# Patient Record
Sex: Male | Born: 1950
Health system: Southern US, Community
[De-identification: ages and names within clinical notes are randomized; demographics above are authoritative.]

## PROBLEM LIST (undated history)

## (undated) ENCOUNTER — Emergency Department (HOSPITAL_COMMUNITY)

## (undated) DIAGNOSIS — I2510 Atherosclerotic heart disease of native coronary artery without angina pectoris: Secondary | ICD-10-CM

## (undated) DIAGNOSIS — H269 Unspecified cataract: Secondary | ICD-10-CM

## (undated) DIAGNOSIS — H8123 Vestibular neuronitis, bilateral: Secondary | ICD-10-CM

## (undated) DIAGNOSIS — E119 Type 2 diabetes mellitus without complications: Secondary | ICD-10-CM

## (undated) DIAGNOSIS — D649 Anemia, unspecified: Secondary | ICD-10-CM

## (undated) DIAGNOSIS — K219 Gastro-esophageal reflux disease without esophagitis: Secondary | ICD-10-CM

## (undated) DIAGNOSIS — I10 Essential (primary) hypertension: Secondary | ICD-10-CM

## (undated) DIAGNOSIS — T7840XA Allergy, unspecified, initial encounter: Secondary | ICD-10-CM

## (undated) HISTORY — DX: Allergy, unspecified, initial encounter: T78.40XA

## (undated) HISTORY — DX: Essential (primary) hypertension: I10

## (undated) HISTORY — DX: Type 2 diabetes mellitus without complications: E11.9

## (undated) HISTORY — DX: Unspecified cataract: H26.9

## (undated) HISTORY — DX: Gastro-esophageal reflux disease without esophagitis: K21.9

## (undated) HISTORY — DX: Atherosclerotic heart disease of native coronary artery without angina pectoris: I25.10

## (undated) HISTORY — PX: EYE SURGERY: SHX253

---

## 2005-07-09 DIAGNOSIS — K219 Gastro-esophageal reflux disease without esophagitis: Secondary | ICD-10-CM | POA: Insufficient documentation

## 2008-02-06 DIAGNOSIS — E782 Mixed hyperlipidemia: Secondary | ICD-10-CM

## 2008-02-06 DIAGNOSIS — E785 Hyperlipidemia, unspecified: Secondary | ICD-10-CM | POA: Insufficient documentation

## 2008-02-06 HISTORY — DX: Mixed hyperlipidemia: E78.2

## 2013-08-12 DIAGNOSIS — Z889 Allergy status to unspecified drugs, medicaments and biological substances status: Secondary | ICD-10-CM | POA: Insufficient documentation

## 2014-10-15 DIAGNOSIS — I10 Essential (primary) hypertension: Secondary | ICD-10-CM | POA: Insufficient documentation

## 2015-04-26 DIAGNOSIS — E349 Endocrine disorder, unspecified: Secondary | ICD-10-CM | POA: Insufficient documentation

## 2018-09-03 LAB — HM COLONOSCOPY

## 2019-07-23 DIAGNOSIS — Z0000 Encounter for general adult medical examination without abnormal findings: Secondary | ICD-10-CM | POA: Diagnosis not present

## 2019-08-03 DIAGNOSIS — J069 Acute upper respiratory infection, unspecified: Secondary | ICD-10-CM | POA: Diagnosis not present

## 2019-08-03 DIAGNOSIS — U071 COVID-19: Secondary | ICD-10-CM | POA: Diagnosis not present

## 2019-08-03 DIAGNOSIS — R42 Dizziness and giddiness: Secondary | ICD-10-CM | POA: Diagnosis not present

## 2019-08-06 DIAGNOSIS — E119 Type 2 diabetes mellitus without complications: Secondary | ICD-10-CM | POA: Diagnosis not present

## 2019-08-06 DIAGNOSIS — E213 Hyperparathyroidism, unspecified: Secondary | ICD-10-CM | POA: Diagnosis not present

## 2019-08-06 DIAGNOSIS — Q8789 Other specified congenital malformation syndromes, not elsewhere classified: Secondary | ICD-10-CM | POA: Diagnosis not present

## 2019-08-06 DIAGNOSIS — Q892 Congenital malformations of other endocrine glands: Secondary | ICD-10-CM | POA: Diagnosis not present

## 2019-08-06 DIAGNOSIS — I10 Essential (primary) hypertension: Secondary | ICD-10-CM | POA: Diagnosis not present

## 2019-08-10 DIAGNOSIS — U071 COVID-19: Secondary | ICD-10-CM | POA: Diagnosis not present

## 2019-08-10 DIAGNOSIS — E782 Mixed hyperlipidemia: Secondary | ICD-10-CM | POA: Diagnosis not present

## 2019-08-10 DIAGNOSIS — K219 Gastro-esophageal reflux disease without esophagitis: Secondary | ICD-10-CM | POA: Diagnosis not present

## 2019-08-10 DIAGNOSIS — E119 Type 2 diabetes mellitus without complications: Secondary | ICD-10-CM | POA: Diagnosis not present

## 2019-08-10 DIAGNOSIS — N529 Male erectile dysfunction, unspecified: Secondary | ICD-10-CM | POA: Diagnosis not present

## 2019-08-10 DIAGNOSIS — Z0001 Encounter for general adult medical examination with abnormal findings: Secondary | ICD-10-CM | POA: Diagnosis not present

## 2019-08-10 DIAGNOSIS — I10 Essential (primary) hypertension: Secondary | ICD-10-CM | POA: Diagnosis not present

## 2019-08-10 DIAGNOSIS — E213 Hyperparathyroidism, unspecified: Secondary | ICD-10-CM | POA: Diagnosis not present

## 2019-11-03 DIAGNOSIS — R5383 Other fatigue: Secondary | ICD-10-CM | POA: Diagnosis not present

## 2019-11-03 DIAGNOSIS — I10 Essential (primary) hypertension: Secondary | ICD-10-CM | POA: Diagnosis not present

## 2019-11-03 DIAGNOSIS — R079 Chest pain, unspecified: Secondary | ICD-10-CM | POA: Diagnosis not present

## 2019-11-03 DIAGNOSIS — E119 Type 2 diabetes mellitus without complications: Secondary | ICD-10-CM | POA: Diagnosis not present

## 2019-11-03 DIAGNOSIS — I2510 Atherosclerotic heart disease of native coronary artery without angina pectoris: Secondary | ICD-10-CM | POA: Diagnosis not present

## 2019-11-03 DIAGNOSIS — Z7982 Long term (current) use of aspirin: Secondary | ICD-10-CM | POA: Diagnosis not present

## 2019-11-03 DIAGNOSIS — R0789 Other chest pain: Secondary | ICD-10-CM | POA: Diagnosis not present

## 2019-11-03 DIAGNOSIS — I219 Acute myocardial infarction, unspecified: Secondary | ICD-10-CM | POA: Insufficient documentation

## 2019-11-03 DIAGNOSIS — E782 Mixed hyperlipidemia: Secondary | ICD-10-CM | POA: Diagnosis not present

## 2019-11-03 DIAGNOSIS — I249 Acute ischemic heart disease, unspecified: Secondary | ICD-10-CM | POA: Diagnosis not present

## 2019-11-03 DIAGNOSIS — I214 Non-ST elevation (NSTEMI) myocardial infarction: Secondary | ICD-10-CM | POA: Diagnosis not present

## 2019-11-03 DIAGNOSIS — R778 Other specified abnormalities of plasma proteins: Secondary | ICD-10-CM | POA: Diagnosis not present

## 2019-11-03 DIAGNOSIS — Z6826 Body mass index (BMI) 26.0-26.9, adult: Secondary | ICD-10-CM | POA: Diagnosis not present

## 2019-11-03 DIAGNOSIS — Z8249 Family history of ischemic heart disease and other diseases of the circulatory system: Secondary | ICD-10-CM | POA: Diagnosis not present

## 2019-11-03 DIAGNOSIS — R739 Hyperglycemia, unspecified: Secondary | ICD-10-CM | POA: Diagnosis not present

## 2019-11-03 DIAGNOSIS — I4510 Unspecified right bundle-branch block: Secondary | ICD-10-CM | POA: Diagnosis not present

## 2019-11-03 DIAGNOSIS — I25110 Atherosclerotic heart disease of native coronary artery with unstable angina pectoris: Secondary | ICD-10-CM | POA: Diagnosis not present

## 2019-11-03 DIAGNOSIS — I160 Hypertensive urgency: Secondary | ICD-10-CM | POA: Diagnosis not present

## 2019-11-03 DIAGNOSIS — K219 Gastro-esophageal reflux disease without esophagitis: Secondary | ICD-10-CM | POA: Diagnosis not present

## 2019-11-03 DIAGNOSIS — E1165 Type 2 diabetes mellitus with hyperglycemia: Secondary | ICD-10-CM | POA: Diagnosis not present

## 2019-11-03 DIAGNOSIS — E785 Hyperlipidemia, unspecified: Secondary | ICD-10-CM | POA: Diagnosis not present

## 2019-11-03 DIAGNOSIS — R9431 Abnormal electrocardiogram [ECG] [EKG]: Secondary | ICD-10-CM | POA: Diagnosis not present

## 2019-11-03 HISTORY — DX: Acute myocardial infarction, unspecified: I21.9

## 2019-11-04 HISTORY — PX: CARDIAC CATHETERIZATION: SHX172

## 2019-11-05 DIAGNOSIS — E785 Hyperlipidemia, unspecified: Secondary | ICD-10-CM | POA: Diagnosis not present

## 2019-11-05 DIAGNOSIS — I2510 Atherosclerotic heart disease of native coronary artery without angina pectoris: Secondary | ICD-10-CM | POA: Diagnosis not present

## 2019-11-05 DIAGNOSIS — I25110 Atherosclerotic heart disease of native coronary artery with unstable angina pectoris: Secondary | ICD-10-CM | POA: Diagnosis not present

## 2019-11-05 DIAGNOSIS — I10 Essential (primary) hypertension: Secondary | ICD-10-CM | POA: Diagnosis not present

## 2019-11-05 DIAGNOSIS — I4510 Unspecified right bundle-branch block: Secondary | ICD-10-CM | POA: Diagnosis not present

## 2019-11-05 DIAGNOSIS — R9431 Abnormal electrocardiogram [ECG] [EKG]: Secondary | ICD-10-CM | POA: Diagnosis not present

## 2019-11-05 DIAGNOSIS — E119 Type 2 diabetes mellitus without complications: Secondary | ICD-10-CM | POA: Diagnosis not present

## 2019-11-05 DIAGNOSIS — I214 Non-ST elevation (NSTEMI) myocardial infarction: Secondary | ICD-10-CM | POA: Diagnosis not present

## 2019-11-06 DIAGNOSIS — I2510 Atherosclerotic heart disease of native coronary artery without angina pectoris: Secondary | ICD-10-CM | POA: Insufficient documentation

## 2019-11-11 DIAGNOSIS — I219 Acute myocardial infarction, unspecified: Secondary | ICD-10-CM | POA: Diagnosis not present

## 2019-11-11 DIAGNOSIS — E7800 Pure hypercholesterolemia, unspecified: Secondary | ICD-10-CM | POA: Diagnosis not present

## 2019-11-11 DIAGNOSIS — I2510 Atherosclerotic heart disease of native coronary artery without angina pectoris: Secondary | ICD-10-CM | POA: Diagnosis not present

## 2019-11-11 DIAGNOSIS — E119 Type 2 diabetes mellitus without complications: Secondary | ICD-10-CM | POA: Diagnosis not present

## 2019-11-19 DIAGNOSIS — I214 Non-ST elevation (NSTEMI) myocardial infarction: Secondary | ICD-10-CM | POA: Diagnosis not present

## 2019-11-19 DIAGNOSIS — Z955 Presence of coronary angioplasty implant and graft: Secondary | ICD-10-CM | POA: Diagnosis not present

## 2019-11-20 DIAGNOSIS — E782 Mixed hyperlipidemia: Secondary | ICD-10-CM | POA: Diagnosis not present

## 2019-11-20 DIAGNOSIS — I10 Essential (primary) hypertension: Secondary | ICD-10-CM | POA: Diagnosis not present

## 2019-11-20 DIAGNOSIS — I214 Non-ST elevation (NSTEMI) myocardial infarction: Secondary | ICD-10-CM | POA: Diagnosis not present

## 2019-11-20 DIAGNOSIS — I25110 Atherosclerotic heart disease of native coronary artery with unstable angina pectoris: Secondary | ICD-10-CM | POA: Diagnosis not present

## 2019-11-20 DIAGNOSIS — E119 Type 2 diabetes mellitus without complications: Secondary | ICD-10-CM | POA: Diagnosis not present

## 2019-11-24 DIAGNOSIS — Z955 Presence of coronary angioplasty implant and graft: Secondary | ICD-10-CM | POA: Diagnosis not present

## 2019-11-24 DIAGNOSIS — I214 Non-ST elevation (NSTEMI) myocardial infarction: Secondary | ICD-10-CM | POA: Diagnosis not present

## 2019-11-26 DIAGNOSIS — I214 Non-ST elevation (NSTEMI) myocardial infarction: Secondary | ICD-10-CM | POA: Diagnosis not present

## 2019-11-26 DIAGNOSIS — Z955 Presence of coronary angioplasty implant and graft: Secondary | ICD-10-CM | POA: Diagnosis not present

## 2019-12-01 DIAGNOSIS — Z955 Presence of coronary angioplasty implant and graft: Secondary | ICD-10-CM | POA: Diagnosis not present

## 2019-12-01 DIAGNOSIS — I214 Non-ST elevation (NSTEMI) myocardial infarction: Secondary | ICD-10-CM | POA: Diagnosis not present

## 2019-12-03 DIAGNOSIS — Z955 Presence of coronary angioplasty implant and graft: Secondary | ICD-10-CM | POA: Diagnosis not present

## 2019-12-03 DIAGNOSIS — I214 Non-ST elevation (NSTEMI) myocardial infarction: Secondary | ICD-10-CM | POA: Diagnosis not present

## 2019-12-08 DIAGNOSIS — I214 Non-ST elevation (NSTEMI) myocardial infarction: Secondary | ICD-10-CM | POA: Diagnosis not present

## 2019-12-08 DIAGNOSIS — Z955 Presence of coronary angioplasty implant and graft: Secondary | ICD-10-CM | POA: Diagnosis not present

## 2019-12-10 DIAGNOSIS — I214 Non-ST elevation (NSTEMI) myocardial infarction: Secondary | ICD-10-CM | POA: Diagnosis not present

## 2019-12-10 DIAGNOSIS — Z955 Presence of coronary angioplasty implant and graft: Secondary | ICD-10-CM | POA: Diagnosis not present

## 2019-12-15 DIAGNOSIS — I214 Non-ST elevation (NSTEMI) myocardial infarction: Secondary | ICD-10-CM | POA: Diagnosis not present

## 2019-12-15 DIAGNOSIS — Z955 Presence of coronary angioplasty implant and graft: Secondary | ICD-10-CM | POA: Diagnosis not present

## 2019-12-17 DIAGNOSIS — Z955 Presence of coronary angioplasty implant and graft: Secondary | ICD-10-CM | POA: Diagnosis not present

## 2019-12-17 DIAGNOSIS — I214 Non-ST elevation (NSTEMI) myocardial infarction: Secondary | ICD-10-CM | POA: Diagnosis not present

## 2019-12-22 DIAGNOSIS — I214 Non-ST elevation (NSTEMI) myocardial infarction: Secondary | ICD-10-CM | POA: Diagnosis not present

## 2019-12-22 DIAGNOSIS — Z955 Presence of coronary angioplasty implant and graft: Secondary | ICD-10-CM | POA: Diagnosis not present

## 2019-12-24 DIAGNOSIS — I214 Non-ST elevation (NSTEMI) myocardial infarction: Secondary | ICD-10-CM | POA: Diagnosis not present

## 2019-12-24 DIAGNOSIS — Z955 Presence of coronary angioplasty implant and graft: Secondary | ICD-10-CM | POA: Diagnosis not present

## 2019-12-28 DIAGNOSIS — Z0000 Encounter for general adult medical examination without abnormal findings: Secondary | ICD-10-CM | POA: Diagnosis not present

## 2019-12-31 DIAGNOSIS — I214 Non-ST elevation (NSTEMI) myocardial infarction: Secondary | ICD-10-CM | POA: Diagnosis not present

## 2019-12-31 DIAGNOSIS — Z955 Presence of coronary angioplasty implant and graft: Secondary | ICD-10-CM | POA: Diagnosis not present

## 2020-01-05 DIAGNOSIS — I214 Non-ST elevation (NSTEMI) myocardial infarction: Secondary | ICD-10-CM | POA: Diagnosis not present

## 2020-01-05 DIAGNOSIS — Z955 Presence of coronary angioplasty implant and graft: Secondary | ICD-10-CM | POA: Diagnosis not present

## 2020-01-07 DIAGNOSIS — Z955 Presence of coronary angioplasty implant and graft: Secondary | ICD-10-CM | POA: Diagnosis not present

## 2020-01-07 DIAGNOSIS — I214 Non-ST elevation (NSTEMI) myocardial infarction: Secondary | ICD-10-CM | POA: Diagnosis not present

## 2020-01-12 DIAGNOSIS — I214 Non-ST elevation (NSTEMI) myocardial infarction: Secondary | ICD-10-CM | POA: Diagnosis not present

## 2020-01-12 DIAGNOSIS — Z955 Presence of coronary angioplasty implant and graft: Secondary | ICD-10-CM | POA: Diagnosis not present

## 2020-01-14 DIAGNOSIS — I214 Non-ST elevation (NSTEMI) myocardial infarction: Secondary | ICD-10-CM | POA: Diagnosis not present

## 2020-01-14 DIAGNOSIS — Z955 Presence of coronary angioplasty implant and graft: Secondary | ICD-10-CM | POA: Diagnosis not present

## 2020-01-19 DIAGNOSIS — Z955 Presence of coronary angioplasty implant and graft: Secondary | ICD-10-CM | POA: Diagnosis not present

## 2020-01-19 DIAGNOSIS — I214 Non-ST elevation (NSTEMI) myocardial infarction: Secondary | ICD-10-CM | POA: Diagnosis not present

## 2020-01-21 DIAGNOSIS — I214 Non-ST elevation (NSTEMI) myocardial infarction: Secondary | ICD-10-CM | POA: Diagnosis not present

## 2020-01-21 DIAGNOSIS — Z955 Presence of coronary angioplasty implant and graft: Secondary | ICD-10-CM | POA: Diagnosis not present

## 2020-01-26 DIAGNOSIS — I214 Non-ST elevation (NSTEMI) myocardial infarction: Secondary | ICD-10-CM | POA: Diagnosis not present

## 2020-01-26 DIAGNOSIS — Z955 Presence of coronary angioplasty implant and graft: Secondary | ICD-10-CM | POA: Diagnosis not present

## 2020-01-28 DIAGNOSIS — Z955 Presence of coronary angioplasty implant and graft: Secondary | ICD-10-CM | POA: Diagnosis not present

## 2020-01-28 DIAGNOSIS — I214 Non-ST elevation (NSTEMI) myocardial infarction: Secondary | ICD-10-CM | POA: Diagnosis not present

## 2020-02-02 DIAGNOSIS — Z955 Presence of coronary angioplasty implant and graft: Secondary | ICD-10-CM | POA: Diagnosis not present

## 2020-02-02 DIAGNOSIS — I214 Non-ST elevation (NSTEMI) myocardial infarction: Secondary | ICD-10-CM | POA: Diagnosis not present

## 2020-02-04 DIAGNOSIS — Z955 Presence of coronary angioplasty implant and graft: Secondary | ICD-10-CM | POA: Diagnosis not present

## 2020-02-04 DIAGNOSIS — I214 Non-ST elevation (NSTEMI) myocardial infarction: Secondary | ICD-10-CM | POA: Diagnosis not present

## 2020-03-01 DIAGNOSIS — I25110 Atherosclerotic heart disease of native coronary artery with unstable angina pectoris: Secondary | ICD-10-CM | POA: Diagnosis not present

## 2020-03-11 DIAGNOSIS — E119 Type 2 diabetes mellitus without complications: Secondary | ICD-10-CM | POA: Diagnosis not present

## 2020-03-11 DIAGNOSIS — E213 Hyperparathyroidism, unspecified: Secondary | ICD-10-CM | POA: Diagnosis not present

## 2020-03-17 DIAGNOSIS — E782 Mixed hyperlipidemia: Secondary | ICD-10-CM | POA: Diagnosis not present

## 2020-03-17 DIAGNOSIS — E7800 Pure hypercholesterolemia, unspecified: Secondary | ICD-10-CM | POA: Diagnosis not present

## 2020-03-17 DIAGNOSIS — I10 Essential (primary) hypertension: Secondary | ICD-10-CM | POA: Diagnosis not present

## 2020-03-17 DIAGNOSIS — Z2821 Immunization not carried out because of patient refusal: Secondary | ICD-10-CM | POA: Diagnosis not present

## 2020-03-17 DIAGNOSIS — K219 Gastro-esophageal reflux disease without esophagitis: Secondary | ICD-10-CM | POA: Diagnosis not present

## 2020-03-17 DIAGNOSIS — I219 Acute myocardial infarction, unspecified: Secondary | ICD-10-CM | POA: Diagnosis not present

## 2020-03-17 DIAGNOSIS — I2510 Atherosclerotic heart disease of native coronary artery without angina pectoris: Secondary | ICD-10-CM | POA: Diagnosis not present

## 2020-03-17 DIAGNOSIS — E119 Type 2 diabetes mellitus without complications: Secondary | ICD-10-CM | POA: Diagnosis not present

## 2020-05-12 DIAGNOSIS — I255 Ischemic cardiomyopathy: Secondary | ICD-10-CM | POA: Diagnosis not present

## 2020-05-12 DIAGNOSIS — I25110 Atherosclerotic heart disease of native coronary artery with unstable angina pectoris: Secondary | ICD-10-CM | POA: Diagnosis not present

## 2020-06-14 DIAGNOSIS — H43811 Vitreous degeneration, right eye: Secondary | ICD-10-CM | POA: Diagnosis not present

## 2020-07-15 DIAGNOSIS — H43811 Vitreous degeneration, right eye: Secondary | ICD-10-CM | POA: Diagnosis not present

## 2020-07-20 DIAGNOSIS — Z0000 Encounter for general adult medical examination without abnormal findings: Secondary | ICD-10-CM | POA: Diagnosis not present

## 2020-08-05 DIAGNOSIS — E119 Type 2 diabetes mellitus without complications: Secondary | ICD-10-CM | POA: Diagnosis not present

## 2020-08-05 DIAGNOSIS — E559 Vitamin D deficiency, unspecified: Secondary | ICD-10-CM | POA: Diagnosis not present

## 2020-08-10 DIAGNOSIS — E7800 Pure hypercholesterolemia, unspecified: Secondary | ICD-10-CM | POA: Diagnosis not present

## 2020-08-10 DIAGNOSIS — E119 Type 2 diabetes mellitus without complications: Secondary | ICD-10-CM | POA: Diagnosis not present

## 2020-08-10 DIAGNOSIS — Z0001 Encounter for general adult medical examination with abnormal findings: Secondary | ICD-10-CM | POA: Diagnosis not present

## 2020-08-10 DIAGNOSIS — I2510 Atherosclerotic heart disease of native coronary artery without angina pectoris: Secondary | ICD-10-CM | POA: Diagnosis not present

## 2020-11-15 DIAGNOSIS — I25110 Atherosclerotic heart disease of native coronary artery with unstable angina pectoris: Secondary | ICD-10-CM | POA: Diagnosis not present

## 2020-11-15 DIAGNOSIS — E782 Mixed hyperlipidemia: Secondary | ICD-10-CM | POA: Diagnosis not present

## 2020-11-15 DIAGNOSIS — E119 Type 2 diabetes mellitus without complications: Secondary | ICD-10-CM | POA: Diagnosis not present

## 2021-01-11 DIAGNOSIS — E782 Mixed hyperlipidemia: Secondary | ICD-10-CM | POA: Diagnosis not present

## 2021-01-11 DIAGNOSIS — I2510 Atherosclerotic heart disease of native coronary artery without angina pectoris: Secondary | ICD-10-CM | POA: Diagnosis not present

## 2021-01-11 DIAGNOSIS — Z7984 Long term (current) use of oral hypoglycemic drugs: Secondary | ICD-10-CM | POA: Diagnosis not present

## 2021-01-11 DIAGNOSIS — I10 Essential (primary) hypertension: Secondary | ICD-10-CM | POA: Diagnosis not present

## 2021-01-11 DIAGNOSIS — E7800 Pure hypercholesterolemia, unspecified: Secondary | ICD-10-CM | POA: Diagnosis not present

## 2021-01-11 DIAGNOSIS — K219 Gastro-esophageal reflux disease without esophagitis: Secondary | ICD-10-CM | POA: Diagnosis not present

## 2021-01-11 DIAGNOSIS — E119 Type 2 diabetes mellitus without complications: Secondary | ICD-10-CM | POA: Diagnosis not present

## 2021-01-11 DIAGNOSIS — N529 Male erectile dysfunction, unspecified: Secondary | ICD-10-CM | POA: Diagnosis not present

## 2021-01-11 DIAGNOSIS — Z6825 Body mass index (BMI) 25.0-25.9, adult: Secondary | ICD-10-CM | POA: Diagnosis not present

## 2021-01-12 DIAGNOSIS — E119 Type 2 diabetes mellitus without complications: Secondary | ICD-10-CM | POA: Diagnosis not present

## 2021-01-12 DIAGNOSIS — I2510 Atherosclerotic heart disease of native coronary artery without angina pectoris: Secondary | ICD-10-CM | POA: Diagnosis not present

## 2021-01-12 DIAGNOSIS — E782 Mixed hyperlipidemia: Secondary | ICD-10-CM | POA: Diagnosis not present

## 2021-01-13 DIAGNOSIS — E210 Primary hyperparathyroidism: Secondary | ICD-10-CM | POA: Insufficient documentation

## 2021-03-24 DIAGNOSIS — E782 Mixed hyperlipidemia: Secondary | ICD-10-CM | POA: Diagnosis not present

## 2021-06-14 ENCOUNTER — Telehealth: Payer: Self-pay

## 2021-06-14 NOTE — Telephone Encounter (Signed)
Jesse Jordan from [facility] asking for patient to have creatine and albumin check with his blood work when order for his visit up coming 08/16/2021. Call back # [phone]. Will also fax over the information needed.

## 2021-06-15 ENCOUNTER — Other Ambulatory Visit: Payer: Self-pay | Admitting: *Deleted

## 2021-06-15 DIAGNOSIS — I10 Essential (primary) hypertension: Secondary | ICD-10-CM

## 2021-06-15 NOTE — Telephone Encounter (Signed)
Will wait until appt to order have no pt history as of yet

## 2021-08-16 ENCOUNTER — Other Ambulatory Visit: Payer: Self-pay

## 2021-08-16 ENCOUNTER — Ambulatory Visit (INDEPENDENT_AMBULATORY_CARE_PROVIDER_SITE_OTHER): Payer: Medicare HMO | Admitting: Internal Medicine

## 2021-08-16 ENCOUNTER — Ambulatory Visit: Payer: Medicare HMO

## 2021-08-16 ENCOUNTER — Encounter: Payer: Self-pay | Admitting: Internal Medicine

## 2021-08-16 VITALS — BP 136/68 | HR 80 | Resp 16 | Ht 70.0 in | Wt 181.0 lb

## 2021-08-16 DIAGNOSIS — E782 Mixed hyperlipidemia: Secondary | ICD-10-CM | POA: Diagnosis not present

## 2021-08-16 DIAGNOSIS — Z1159 Encounter for screening for other viral diseases: Secondary | ICD-10-CM

## 2021-08-16 DIAGNOSIS — I10 Essential (primary) hypertension: Secondary | ICD-10-CM | POA: Diagnosis not present

## 2021-08-16 DIAGNOSIS — I2510 Atherosclerotic heart disease of native coronary artery without angina pectoris: Secondary | ICD-10-CM

## 2021-08-16 DIAGNOSIS — E559 Vitamin D deficiency, unspecified: Secondary | ICD-10-CM | POA: Diagnosis not present

## 2021-08-16 DIAGNOSIS — Z2821 Immunization not carried out because of patient refusal: Secondary | ICD-10-CM | POA: Diagnosis not present

## 2021-08-16 DIAGNOSIS — K219 Gastro-esophageal reflux disease without esophagitis: Secondary | ICD-10-CM | POA: Diagnosis not present

## 2021-08-16 DIAGNOSIS — H8120 Vestibular neuronitis, unspecified ear: Secondary | ICD-10-CM | POA: Insufficient documentation

## 2021-08-16 DIAGNOSIS — E1169 Type 2 diabetes mellitus with other specified complication: Secondary | ICD-10-CM | POA: Diagnosis not present

## 2021-08-16 DIAGNOSIS — E1165 Type 2 diabetes mellitus with hyperglycemia: Secondary | ICD-10-CM | POA: Insufficient documentation

## 2021-08-16 DIAGNOSIS — N529 Male erectile dysfunction, unspecified: Secondary | ICD-10-CM | POA: Insufficient documentation

## 2021-08-16 DIAGNOSIS — E119 Type 2 diabetes mellitus without complications: Secondary | ICD-10-CM | POA: Insufficient documentation

## 2021-08-16 MED ORDER — SILDENAFIL CITRATE 50 MG PO TABS: 50.0000 mg | ORAL_TABLET | Freq: Every day | ORAL | 2 refills | Status: DC | PRN

## 2021-08-16 NOTE — Assessment & Plan Note (Signed)
Takes Nexium as needed 

## 2021-08-16 NOTE — Assessment & Plan Note (Signed)
BP Readings from Last 1 Encounters:  08/16/21 136/68   Well-controlled with lisinopril and Coreg Counseled for compliance with the medications Advised DASH diet and moderate exercise/walking, at least 150 mins/week

## 2021-08-16 NOTE — Assessment & Plan Note (Signed)
Could be due to medications Viagra 50 mg daily as needed

## 2021-08-16 NOTE — Progress Notes (Signed)
New Patient Office Visit  Subjective:  Patient ID: Jesse Jordan, male    DOB: December 07, 1950  Age: 70 y.o. MRN: [id number]  CC:  Chief Complaint  Patient presents with   New Patient (Initial Visit)    New patient moved here from [place]     HPI Jesse Jordan is a 70 y.o. male with past medical history of CAD s/p stent placement (2021), HTN, GERD, type II DM with HLD who presents for establishing care.  HTN: BP is well-controlled. Takes medications regularly. Patient denies headache, dizziness, chest pain, dyspnea or palpitations.  CAD s/p stent placement in 2021: He denies any chest pain currently.  He is on aspirin, Plavix and statin currently.  She does not have any local cardiologist.  Type II DM with HLD: He is on [place] currently.  He denies any polyuria or polydipsia.  He does not recall his last HbA1C.  He is also on Lipitor 80 mg and Zetia 10 mg daily.  He takes Nexium for GERD.  Denies any dysphagia or odynophagia.  He complains of erectile dysfunction.  Denies any dysuria or hematuria.  He has tried Cialis in the past.  He refused flu vaccine today.  Past Medical History:  Diagnosis Date   Myocardial infarction ([place]) 11/03/2019    History reviewed. No pertinent surgical history.  History reviewed. No pertinent family history.  Social History   Socioeconomic History   Marital status: Married    Spouse name: Not on file   Number of children: Not on file   Years of education: Not on file   Highest education level: Not on file  Occupational History   Not on file  Tobacco Use   Smoking status: Never   Smokeless tobacco: Never  Substance and Sexual Activity   Alcohol use: Never   Drug use: Never   Sexual activity: Not on file  Other Topics Concern   Not on file  Social History Narrative   Not on file   Social Determinants of Health   Financial Resource Strain: Not on file  Food Insecurity: Not on file  Transportation Needs: Not on file  Physical  Activity: Not on file  Stress: Not on file  Social Connections: Not on file  Intimate Partner Violence: Not on file    ROS Review of Systems  Constitutional:  Negative for chills and fever.  HENT:  Negative for congestion and sore throat.   Eyes:  Negative for pain and discharge.  Respiratory:  Negative for cough and shortness of breath.   Cardiovascular:  Negative for chest pain and palpitations.  Gastrointestinal:  Negative for constipation, diarrhea, nausea and vomiting.  Endocrine: Negative for polydipsia and polyuria.  Genitourinary:  Negative for dysuria and hematuria.  Musculoskeletal:  Negative for neck pain and neck stiffness.  Skin:  Negative for rash.  Neurological:  Negative for dizziness, weakness, numbness and headaches.  Psychiatric/Behavioral:  Negative for agitation and behavioral problems.    Objective:   Today's Vitals: BP 136/68 (BP Location: Left Arm, Patient Position: Sitting, Cuff Size: Normal)   Pulse 80   Resp 16   Ht _0  (1.778 m)   Wt 181 lb (82.1 kg)   SpO2 99%   BMI 25.97 kg/m   Physical Exam Vitals reviewed.  Constitutional:      General: He is not in acute distress.    Appearance: He is not diaphoretic.  HENT:     Head: Normocephalic and atraumatic.     Nose: Nose normal.  Mouth/Throat:     Mouth: Mucous membranes are moist.  Eyes:     General: No scleral icterus.    Extraocular Movements: Extraocular movements intact.  Cardiovascular:     Rate and Rhythm: Normal rate and regular rhythm.     Pulses: Normal pulses.     Heart sounds: Normal heart sounds. No murmur heard. Pulmonary:     Breath sounds: Normal breath sounds. No wheezing or rales.  Abdominal:     Palpations: Abdomen is soft.     Tenderness: There is no abdominal tenderness.  Musculoskeletal:     Cervical back: Neck supple. No tenderness.     Right lower leg: No edema.     Left lower leg: No edema.  Skin:    General: Skin is warm.     Findings: No rash.   Neurological:     General: No focal deficit present.     Mental Status: He is alert and oriented to person, place, and time.     Sensory: No sensory deficit.     Motor: No weakness.  Psychiatric:        Mood and Affect: Mood normal.        Behavior: Behavior normal.    Assessment & Plan:   Problem List Items Addressed This Visit       Cardiovascular and Mediastinum   CAD (coronary artery disease)    S/p stent placement On aspirin, Plavix and statin currently Referred to cardiology      Relevant Medications   atorvastatin (LIPITOR) 80 MG tablet   carvedilol (COREG) 3.125 MG tablet   lisinopril (ZESTRIL) 20 MG tablet   nitroGLYCERIN (NITROSTAT) 0.4 MG SL tablet   aspirin EC 81 MG tablet   ezetimibe (ZETIA) 10 MG tablet   sildenafil (VIAGRA) 50 MG tablet   Other Relevant Orders   Ambulatory referral to Cardiology   TSH   Essential hypertension - Primary    BP Readings from Last 1 Encounters:  08/16/21 136/68  Well-controlled with lisinopril and Coreg Counseled for compliance with the medications Advised DASH diet and moderate exercise/walking, at least 150 mins/week      Relevant Medications   atorvastatin (LIPITOR) 80 MG tablet   carvedilol (COREG) 3.125 MG tablet   lisinopril (ZESTRIL) 20 MG tablet   nitroGLYCERIN (NITROSTAT) 0.4 MG SL tablet   aspirin EC 81 MG tablet   ezetimibe (ZETIA) 10 MG tablet   sildenafil (VIAGRA) 50 MG tablet   Other Relevant Orders   TSH   CMP14+EGFR   CBC with Differential/Platelet     Digestive   Gastroesophageal reflux disease    Takes Nexium as needed        Endocrine   Diabetes mellitus ([place])    With CAD, HTN and HLD On Farxiga 10 mg daily Advised to follow diabetic diet On statin and ACEi F/u CMP and lipid panel Diabetic foot exam: Today Diabetic eye exam: Advised to follow up with Ophthalmology for diabetic eye exam       Relevant Medications   atorvastatin (LIPITOR) 80 MG tablet   FARXIGA 10 MG TABS tablet    lisinopril (ZESTRIL) 20 MG tablet   aspirin EC 81 MG tablet   Other Relevant Orders   Hemoglobin A1c   CMP14+EGFR     Other   HLD (hyperlipidemia)    On Lipitor and Zetia Check lipid profile      Relevant Medications   atorvastatin (LIPITOR) 80 MG tablet   carvedilol (COREG) 3.125 MG tablet  lisinopril (ZESTRIL) 20 MG tablet   nitroGLYCERIN (NITROSTAT) 0.4 MG SL tablet   aspirin EC 81 MG tablet   ezetimibe (ZETIA) 10 MG tablet   sildenafil (VIAGRA) 50 MG tablet   Other Relevant Orders   Lipid Profile   Erectile dysfunction    Could be due to medications Viagra 50 mg daily as needed      Relevant Medications   sildenafil (VIAGRA) 50 MG tablet   Other Visit Diagnoses     Vitamin D deficiency       Relevant Orders   VITAMIN D 25 Hydroxy (Vit-D Deficiency, Fractures)   Need for hepatitis C screening test       Relevant Orders   Hepatitis C Antibody   Refused influenza vaccine           Outpatient Encounter Medications as of 08/16/2021  Medication Sig   aspirin EC 81 MG tablet Take 81 mg by mouth daily. Swallow whole.   atorvastatin (LIPITOR) 80 MG tablet Take 80 mg by mouth at bedtime.   carvedilol (COREG) 3.125 MG tablet Take 3.125 mg by mouth 2 (two) times daily.   cetirizine (ZYRTEC) 10 MG tablet Take by mouth.   clopidogrel (PLAVIX) 75 MG tablet Take 75 mg by mouth daily.   ezetimibe (ZETIA) 10 MG tablet Take 10 mg by mouth daily.   FARXIGA 10 MG TABS tablet Take 10 mg by mouth daily.   GARLIC PO Take by mouth.   Ginkgo Biloba (GNP GINGKO BILOBA EXTRACT PO) Take by mouth.   lisinopril (ZESTRIL) 20 MG tablet Take 20 mg by mouth daily.   nitroGLYCERIN (NITROSTAT) 0.4 MG SL tablet    sildenafil (VIAGRA) 50 MG tablet Take 1 tablet (50 mg total) by mouth daily as needed for erectile dysfunction.   No facility-administered encounter medications on file as of 08/16/2021.    Follow-up: Return in about 4 months (around 12/14/2021) for HTN and DM.   [name], MD

## 2021-08-16 NOTE — Assessment & Plan Note (Addendum)
On Lipitor and Zetia Check lipid profile

## 2021-08-16 NOTE — Patient Instructions (Signed)
Please continue taking medications as prescribed. ? ?Please continue to follow low salt diet and ambulate as tolerated. ? ?You are being referred to Cardiology. ?

## 2021-08-16 NOTE — Assessment & Plan Note (Signed)
With CAD, HTN and HLD On Farxiga 10 mg daily Advised to follow diabetic diet On statin and ACEi F/u CMP and lipid panel Diabetic foot exam: Today Diabetic eye exam: Advised to follow up with Ophthalmology for diabetic eye exam

## 2021-08-16 NOTE — Assessment & Plan Note (Signed)
S/p stent placement On aspirin, Plavix and statin currently Referred to cardiology

## 2021-08-17 LAB — CBC WITH DIFFERENTIAL/PLATELET
Basophils Absolute: 0 10*3/uL (ref 0.0–0.2)
Basos: 0 %
EOS (ABSOLUTE): 0.1 10*3/uL (ref 0.0–0.4)
Eos: 1 %
Hematocrit: 47.8 % (ref 37.5–51.0)
Hemoglobin: 16.2 g/dL (ref 13.0–17.7)
Immature Grans (Abs): 0 10*3/uL (ref 0.0–0.1)
Immature Granulocytes: 0 %
Lymphocytes Absolute: 1.5 10*3/uL (ref 0.7–3.1)
Lymphs: 26 %
MCH: 32 pg (ref 26.6–33.0)
MCHC: 33.9 g/dL (ref 31.5–35.7)
MCV: 94 fL (ref 79–97)
Monocytes Absolute: 0.5 10*3/uL (ref 0.1–0.9)
Monocytes: 10 %
Neutrophils Absolute: 3.5 10*3/uL (ref 1.4–7.0)
Neutrophils: 63 %
Platelets: 182 10*3/uL (ref 150–450)
RBC: 5.07 x10E6/uL (ref 4.14–5.80)
RDW: 12.2 % (ref 11.6–15.4)
WBC: 5.5 10*3/uL (ref 3.4–10.8)

## 2021-08-17 LAB — LIPID PANEL
Chol/HDL Ratio: 3.6 ratio (ref 0.0–5.0)
Cholesterol, Total: 127 mg/dL (ref 100–199)
HDL: 35 mg/dL — ABNORMAL LOW (ref >39)
LDL Chol Calc (NIH): 68 mg/dL (ref 0–99)
Triglycerides: 135 mg/dL (ref 0–149)
VLDL Cholesterol Cal: 24 mg/dL (ref 5–40)

## 2021-08-17 LAB — CMP14+EGFR
ALT: 33 IU/L (ref 0–44)
AST: 25 IU/L (ref 0–40)
Albumin/Globulin Ratio: 1.9 (ref 1.2–2.2)
Albumin: 5 g/dL — ABNORMAL HIGH (ref 3.8–4.8)
Alkaline Phosphatase: 92 IU/L (ref 44–121)
BUN/Creatinine Ratio: 36 — ABNORMAL HIGH (ref 10–24)
BUN: 35 mg/dL — ABNORMAL HIGH (ref 8–27)
Bilirubin Total: 0.6 mg/dL (ref 0.0–1.2)
CO2: 20 mmol/L (ref 20–29)
Calcium: 10.8 mg/dL — ABNORMAL HIGH (ref 8.6–10.2)
Chloride: 103 mmol/L (ref 96–106)
Creatinine, Ser: 0.97 mg/dL (ref 0.76–1.27)
Globulin, Total: 2.6 g/dL (ref 1.5–4.5)
Glucose: 172 mg/dL — ABNORMAL HIGH (ref 70–99)
Potassium: 4.9 mmol/L (ref 3.5–5.2)
Sodium: 138 mmol/L (ref 134–144)
Total Protein: 7.6 g/dL (ref 6.0–8.5)
eGFR: 85 mL/min/{1.73_m2} (ref >59)

## 2021-08-17 LAB — HEMOGLOBIN A1C
Est. average glucose Bld gHb Est-mCnc: 194 mg/dL
Hgb A1c MFr Bld: 8.4 % — ABNORMAL HIGH (ref 4.8–5.6)

## 2021-08-17 LAB — HEPATITIS C ANTIBODY: Hep C Virus Ab: 0.1 s/co ratio (ref 0.0–0.9)

## 2021-08-17 LAB — TSH: TSH: 2.26 u[IU]/mL (ref 0.450–4.500)

## 2021-08-17 LAB — VITAMIN D 25 HYDROXY (VIT D DEFICIENCY, FRACTURES): Vit D, 25-Hydroxy: 48.7 ng/mL (ref 30.0–100.0)

## 2021-08-22 ENCOUNTER — Encounter: Payer: Self-pay | Admitting: *Deleted

## 2021-08-24 ENCOUNTER — Other Ambulatory Visit: Payer: Self-pay

## 2021-08-24 ENCOUNTER — Ambulatory Visit (INDEPENDENT_AMBULATORY_CARE_PROVIDER_SITE_OTHER): Payer: Medicare HMO

## 2021-08-24 DIAGNOSIS — Z0000 Encounter for general adult medical examination without abnormal findings: Secondary | ICD-10-CM

## 2021-08-24 DIAGNOSIS — E1169 Type 2 diabetes mellitus with other specified complication: Secondary | ICD-10-CM

## 2021-08-24 MED ORDER — FARXIGA 10 MG PO TABS: 10.0000 mg | ORAL_TABLET | Freq: Every day | ORAL | 0 refills | Status: DC

## 2021-08-24 NOTE — Patient Instructions (Addendum)
Jesse Jordan , Thank you for taking time to come for your Medicare Wellness Visit. I appreciate your ongoing commitment to your health goals. Please review the following plan we discussed and let me know if I can assist you in the future.   These are the goals we discussed:  Goals      HEMOGLOBIN A1C < 7        This is a list of the screening recommended for you and due dates:  Health Maintenance  Topic Date Due   Eye exam for diabetics  Never done   COVID-19 Vaccine (1) 09/01/2021*   Flu Shot  12/22/2021*   Hemoglobin A1C  02/13/2022   Complete foot exam   08/16/2022   Colon Cancer Screening  09/03/2028   Hepatitis C Screening: USPSTF Recommendation to screen - Ages 18-79 yo.  Completed   HPV Vaccine  Aged Out   Pneumonia Vaccine  Discontinued   Tetanus Vaccine  Discontinued   Zoster (Shingles) Vaccine  Discontinued  *Topic was postponed. The date shown is not the original due date.      Health Maintenance, Male Adopting a healthy lifestyle and getting preventive care are important in promoting health and wellness. Ask your health care provider about: The right schedule for you to have regular tests and exams. Things you can do on your own to prevent diseases and keep yourself healthy. What should I know about diet, weight, and exercise? Eat a healthy diet  Eat a diet that includes plenty of vegetables, fruits, low-fat dairy products, and lean protein. Do not eat a lot of foods that are high in solid fats, added sugars, or sodium. Maintain a healthy weight Body mass index (BMI) is a measurement that can be used to identify possible weight problems. It estimates body fat based on height and weight. Your health care provider can help determine your BMI and help you achieve or maintain a healthy weight. Get regular exercise Get regular exercise. This is one of the most important things you can do for your health. Most adults should: Exercise for at least 150 minutes each week.  The exercise should increase your heart rate and make you sweat (moderate-intensity exercise). Do strengthening exercises at least twice a week. This is in addition to the moderate-intensity exercise. Spend less time sitting. Even light physical activity can be beneficial. Watch cholesterol and blood lipids Have your blood tested for lipids and cholesterol at 68 years of age, then have this test every 5 years. You may need to have your cholesterol levels checked more often if: Your lipid or cholesterol levels are high. You are older than 68 years of age. You are at high risk for heart disease. What should I know about cancer screening? Many types of cancers can be detected early and may often be prevented. Depending on your health history and family history, you may need to have cancer screening at various ages. This may include screening for: Colorectal cancer. Prostate cancer. Skin cancer. Lung cancer. What should I know about heart disease, diabetes, and high blood pressure? Blood pressure and heart disease High blood pressure causes heart disease and increases the risk of stroke. This is more likely to develop in people who have high blood pressure readings or are overweight. Talk with your health care provider about your target blood pressure readings. Have your blood pressure checked: Every 3-5 years if you are 78-17 years of age. Every year if you are 21 years old or older. If you  are between the ages of 19 and 15 and are a current or former smoker, ask your health care provider if you should have a one-time screening for abdominal aortic aneurysm (AAA). Diabetes Have regular diabetes screenings. This checks your fasting blood sugar level. Have the screening done: Once every three years after age 62 if you are at a normal weight and have a low risk for diabetes. More often and at a younger age if you are overweight or have a high risk for diabetes. What should I know about  preventing infection? Hepatitis B If you have a higher risk for hepatitis B, you should be screened for this virus. Talk with your health care provider to find out if you are at risk for hepatitis B infection. Hepatitis C Blood testing is recommended for: Everyone born from [birth year] through 1963. Anyone with known risk factors for hepatitis C. Sexually transmitted infections (STIs) You should be screened each year for STIs, including gonorrhea and chlamydia, if: You are sexually active and are younger than 68 years of age. You are older than 68 years of age and your health care provider tells you that you are at risk for this type of infection. Your sexual activity has changed since you were last screened, and you are at increased risk for chlamydia or gonorrhea. Ask your health care provider if you are at risk. Ask your health care provider about whether you are at high risk for HIV. Your health care provider may recommend a prescription medicine to help prevent HIV infection. If you choose to take medicine to prevent HIV, you should first get tested for HIV. You should then be tested every 3 months for as long as you are taking the medicine. Follow these instructions at home: Alcohol use Do not drink alcohol if your health care provider tells you not to drink. If you drink alcohol: Limit how much you have to 0-2 drinks a day. Know how much alcohol is in your drink. In the [place] one drink equals one 12 oz bottle of beer (355 mL), one 5 oz glass of wine (148 mL), or one 1 oz glass of hard liquor (44 mL). Lifestyle Do not use any products that contain nicotine or tobacco. These products include cigarettes, chewing tobacco, and vaping devices, such as e-cigarettes. If you need help quitting, ask your health care provider. Do not use street drugs. Do not share needles. Ask your health care provider for help if you need support or information about quitting drugs. General instructions Schedule  regular health, dental, and eye exams. Stay current with your vaccines. Tell your health care provider if: You often feel depressed. You have ever been abused or do not feel safe at home. Summary Adopting a healthy lifestyle and getting preventive care are important in promoting health and wellness. Follow your health care provider's instructions about healthy diet, exercising, and getting tested or screened for diseases. Follow your health care provider's instructions on monitoring your cholesterol and blood pressure. This information is not intended to replace advice given to you by your health care provider. Make sure you discuss any questions you have with your health care provider. Document Revised: 01/30/2021 Document Reviewed: 01/30/2021 Elsevier Patient Education  2022 [employer].

## 2021-08-24 NOTE — Progress Notes (Signed)
I connected with  Jesse Jordan on 08/24/21 by a audio enabled telemedicine application and verified that I am speaking with the correct person using two identifiers.  Patient Location: Home  Provider Location: Office/Clinic  I discussed the limitations of evaluation and management by telemedicine. The patient expressed understanding and agreed to proceed.  Subjective:   Jesse Jordan is a 70 y.o. male who presents for an Initial Medicare Annual Wellness Visit.  Review of Systems     Cardiac Risk Factors include: advanced age (>43men, >62 women);diabetes mellitus;male gender     Objective:    There were no vitals filed for this visit. There is no height or weight on file to calculate BMI.  Advanced Directives 08/24/2021  Does Patient Have a Medical Advance Directive? No    Current Medications (verified) Outpatient Encounter Medications as of 08/24/2021  Medication Sig   aspirin EC 81 MG tablet Take 81 mg by mouth daily. Swallow whole.   atorvastatin (LIPITOR) 80 MG tablet Take 80 mg by mouth at bedtime.   carvedilol (COREG) 3.125 MG tablet Take 3.125 mg by mouth 2 (two) times daily.   cetirizine (ZYRTEC) 10 MG tablet Take by mouth.   clopidogrel (PLAVIX) 75 MG tablet Take 75 mg by mouth daily.   ezetimibe (ZETIA) 10 MG tablet Take 10 mg by mouth daily.   GARLIC PO Take by mouth.   Ginkgo Biloba (GNP GINGKO BILOBA EXTRACT PO) Take by mouth.   lisinopril (ZESTRIL) 20 MG tablet Take 20 mg by mouth daily.   nitroGLYCERIN (NITROSTAT) 0.4 MG SL tablet    sildenafil (VIAGRA) 50 MG tablet Take 1 tablet (50 mg total) by mouth daily as needed for erectile dysfunction.   [DISCONTINUED] FARXIGA 10 MG TABS tablet Take 10 mg by mouth daily.   FARXIGA 10 MG TABS tablet Take 1 tablet (10 mg total) by mouth daily.   No facility-administered encounter medications on file as of 08/24/2021.    Allergies (verified) Amoxicillin-pot clavulanate   History: Past Medical History:  Diagnosis Date    Allergy 1975   Mold, pollen - shots then but now treated by over the counter meds   Cataract 2018   Removed   Diabetes mellitus without complication (HCC) 1995   Type 2 - medication   GERD (gastroesophageal reflux disease) 1985   Treated by medication   Hypertension 2015   Treated by medication   Myocardial infarction (HCC) 11/03/2019   Past Surgical History:  Procedure Laterality Date   EYE SURGERY  2018   Cataracts removed   Family History  Problem Relation Age of Onset   Cancer Father    Heart disease Father    Social History   Socioeconomic History   Marital status: Married    Spouse name: Not on file   Number of children: Not on file   Years of education: Not on file   Highest education level: Not on file  Occupational History   Not on file  Tobacco Use   Smoking status: Never   Smokeless tobacco: Never   Tobacco comments:    Never  Substance and Sexual Activity   Alcohol use: Not Currently    Comment: Many years ago   Drug use: Not Currently    Types: Marijuana    Comment: Many years ago - 1973   Sexual activity: Yes    Birth control/protection: Other-see comments, None    Comment: Active some but have ED.  Other Topics Concern   Not on  file  Social History Narrative   Not on file   Social Determinants of Health   Financial Resource Strain: Not on file  Food Insecurity: Not on file  Transportation Needs: No Transportation Needs   Lack of Transportation (Medical): No   Lack of Transportation (Non-Medical): No  Physical Activity: Sufficiently Active   Days of Exercise per Week: 6 days   Minutes of Exercise per Session: 60 min  Stress: Not on file  Social Connections: Socially Integrated   Frequency of Communication with Friends and Family: More than three times a week   Frequency of Social Gatherings with Friends and Family: More than three times a week   Attends Religious Services: More than 4 times per year   Active Member of [employer] or  Organizations: Yes   Attends Engineer, structural: More than 4 times per year   Marital Status: Married    Tobacco Counseling Counseling given: Not Answered Tobacco comments: Never   Clinical Intake:  Pre-visit preparation completed: No  Pain : No/denies pain     Nutritional Status: BMI 25 -29 Overweight Diabetes: Yes CBG done?: No Did pt. bring in CBG monitor from home?: No  How often do you need to have someone help you when you read instructions, pamphlets, or other written materials from your doctor or pharmacy?: 2 - Rarely What is the last grade level you completed in school?: college  Diabetic? Nutrition Risk Assessment:  Has the patient had any N/V/D within the last 2 months?  No  Does the patient have any non-healing wounds?  No  Has the patient had any unintentional weight loss or weight gain?  No   Diabetes:  Is the patient diabetic?  Yes  If diabetic, was a CBG obtained today?  No  Did the patient bring in their glucometer from home?  No  How often do you monitor your CBG's? daily.   Financial Strains and Diabetes Management:  Are you having any financial strains with the device, your supplies or your medication? No .  Does the patient want to be seen by Chronic Care Management for management of their diabetes?  No  Would the patient like to be referred to a Nutritionist or for Diabetic Management?   No  Diabetic Exams:  Diabetic Eye Exam: Overdue for diabetic eye exam. Pt has been advised about the importance in completing this exam. Patient advised to call and schedule an eye exam. Diabetic Foot Exam: Completed             Activities of Daily Living In your present state of health, do you have any difficulty performing the following activities: 08/24/2021 08/23/2021  Hearing? N N  Vision? N N  Difficulty concentrating or making decisions? N Y  Walking or climbing stairs? N N  Dressing or bathing? N N  Doing errands, shopping? N N   Preparing Food and eating ? - N  Using the Toilet? - N  In the past six months, have you accidently leaked urine? - N  Do you have problems with loss of bowel control? - Y  Managing your Medications? - N  Managing your Finances? - N  Housekeeping or managing your Housekeeping? - N    Patient Care Team: Anabel Halon, MD as PCP - General (Internal Medicine)  Indicate any recent Medical Services you may have received from other than [facility] providers in the past year (date may be approximate).     Assessment:   This is a  routine wellness examination for Jesse Jordan.  Hearing/Vision screen No results found.  Dietary issues and exercise activities discussed:     Goals Addressed             This Visit's Progress    HEMOGLOBIN A1C < 7       Patient Stated       Get diabetic eye exam- recommended yearly       Depression Screen PHQ 2/9 Scores 08/24/2021 08/16/2021  PHQ - 2 Score 0 0    Fall Risk Fall Risk  08/24/2021 08/23/2021 08/16/2021  Falls in the past year? 0 0 0  Number falls in past yr: 0 0 0  Injury with Fall? 0 0 0  Risk for fall due to : - - No Fall Risks  Follow up - - Falls evaluation completed    FALL RISK PREVENTION PERTAINING TO THE HOME:  Any stairs in or around the home? No  If so, are there any without handrails? No  Home free of loose throw rugs in walkways, pet beds, electrical cords, etc? Yes  Adequate lighting in your home to reduce risk of falls? Yes   ASSISTIVE DEVICES UTILIZED TO PREVENT FALLS:  Life alert? No  Use of a cane, walker or w/c? No  Grab bars in the bathroom? No  Shower chair or bench in shower? No  Elevated toilet seat or a handicapped toilet? No   Cognitive Function:     6CIT Screen 08/24/2021  What Year? 0 points  What month? 0 points  What time? 0 points  Count back from 20 0 points  Months in reverse 0 points  Repeat phrase 0 points  Total Score 0    Immunizations  There is no immunization history on file for  this patient.  TDAP status: Due, Education has been provided regarding the importance of this vaccine. Advised may receive this vaccine at local pharmacy or Health Dept. Aware to provide a copy of the vaccination record if obtained from local pharmacy or Health Dept. Verbalized acceptance and understanding.  Flu Vaccine status: Declined, Education has been provided regarding the importance of this vaccine but patient still declined. Advised may receive this vaccine at local pharmacy or Health Dept. Aware to provide a copy of the vaccination record if obtained from local pharmacy or Health Dept. Verbalized acceptance and understanding.  Pneumococcal vaccine status: Declined,  Education has been provided regarding the importance of this vaccine but patient still declined. Advised may receive this vaccine at local pharmacy or Health Dept. Aware to provide a copy of the vaccination record if obtained from local pharmacy or Health Dept. Verbalized acceptance and understanding.   Covid-19 vaccine status: Declined, Education has been provided regarding the importance of this vaccine but patient still declined. Advised may receive this vaccine at local pharmacy or Health Dept.or vaccine clinic. Aware to provide a copy of the vaccination record if obtained from local pharmacy or Health Dept. Verbalized acceptance and understanding.  Qualifies for Shingles Vaccine? No   Zostavax completed No   Shingrix Completed?: No.    Education has been provided regarding the importance of this vaccine. Patient has been advised to call insurance company to determine out of pocket expense if they have not yet received this vaccine. Advised may also receive vaccine at local pharmacy or Health Dept. Verbalized acceptance and understanding.  Screening Tests Health Maintenance  Topic Date Due   OPHTHALMOLOGY EXAM  Never done   COVID-19 Vaccine (1) 09/01/2021 (Originally 05/02/1951)  INFLUENZA VACCINE  12/22/2021 (Originally  04/24/2021)   HEMOGLOBIN A1C  02/13/2022   FOOT EXAM  08/16/2022   COLONOSCOPY (Pts 45-17yrs Insurance coverage will need to be confirmed)  09/03/2028   Hepatitis C Screening  Completed   HPV VACCINES  Aged Out   Pneumonia Vaccine 81+ Years old  Discontinued   TETANUS/TDAP  Discontinued   Zoster Vaccines- Shingrix  Discontinued    Health Maintenance  Health Maintenance Due  Topic Date Due   OPHTHALMOLOGY EXAM  Never done    Colorectal cancer screening: Type of screening: Colonoscopy. Completed 2019. Repeat every 10 years  Lung Cancer Screening: (Low Dose CT Chest recommended if Age 88-80 years, 30 pack-year currently smoking OR have quit w/in 15years.) does not qualify.   Lung Cancer Screening Referral: na  Additional Screening:  Hepatitis C Screening: does not qualify; Completed   Vision Screening: Recommended annual ophthalmology exams for early detection of glaucoma and other disorders of the eye. Is the patient up to date with their annual eye exam?  No  Who is the provider or what is the name of the office in which the patient attends annual eye exams? Referral to dr cotter If pt is not established with a provider, would they like to be referred to a provider to establish care? Yes .   Dental Screening: Recommended annual dental exams for proper oral hygiene  Community Resource Referral / Chronic Care Management: CRR required this visit?  No   CCM required this visit?  No      Plan:     I have personally reviewed and noted the following in the patient's chart:   Medical and social history Use of alcohol, tobacco or illicit drugs  Current medications and supplements including opioid prescriptions. Patient is not currently taking opioid prescriptions. Functional ability and status Nutritional status Physical activity Advanced directives List of other physicians Hospitalizations, surgeries, and ER visits in previous 12 months Vitals Screenings to include  cognitive, depression, and falls Referrals and appointments  In addition, I have reviewed and discussed with patient certain preventive protocols, quality metrics, and best practice recommendations. A written personalized care plan for preventive services as well as general preventive health recommendations were provided to patient.     Everitt Amber, LPN, LPN   [id number]   Nurse Notes:  Jesse Jordan , Thank you for taking time to come for your Medicare Wellness Visit. I appreciate your ongoing commitment to your health goals. Please review the following plan we discussed and let me know if I can assist you in the future.   These are the goals we discussed:  Goals      HEMOGLOBIN A1C < 7     Patient Stated     Get diabetic eye exam- recommended yearly        This is a list of the screening recommended for you and due dates:  Health Maintenance  Topic Date Due   Eye exam for diabetics  Never done   COVID-19 Vaccine (1) 09/01/2021*   Flu Shot  12/22/2021*   Hemoglobin A1C  02/13/2022   Complete foot exam   08/16/2022   Colon Cancer Screening  09/03/2028   Hepatitis C Screening: USPSTF Recommendation to screen - Ages 18-79 yo.  Completed   HPV Vaccine  Aged Out   Pneumonia Vaccine  Discontinued   Tetanus Vaccine  Discontinued   Zoster (Shingles) Vaccine  Discontinued  *Topic was postponed. The date shown is not the original due date.

## 2021-08-25 ENCOUNTER — Ambulatory Visit: Payer: Medicare HMO | Admitting: Cardiovascular Disease

## 2021-08-28 ENCOUNTER — Encounter: Payer: Self-pay | Admitting: Internal Medicine

## 2021-08-29 ENCOUNTER — Ambulatory Visit (INDEPENDENT_AMBULATORY_CARE_PROVIDER_SITE_OTHER): Payer: Medicare HMO | Admitting: Nurse Practitioner

## 2021-08-29 ENCOUNTER — Other Ambulatory Visit: Payer: Self-pay

## 2021-08-29 DIAGNOSIS — R051 Acute cough: Secondary | ICD-10-CM

## 2021-08-29 DIAGNOSIS — J019 Acute sinusitis, unspecified: Secondary | ICD-10-CM | POA: Insufficient documentation

## 2021-08-29 DIAGNOSIS — J018 Other acute sinusitis: Secondary | ICD-10-CM

## 2021-08-29 MED ORDER — FLUTICASONE PROPIONATE 50 MCG/ACT NA SUSP: 2.0000 | Freq: Every day | NASAL | 6 refills | Status: DC

## 2021-08-29 MED ORDER — BENZONATATE 100 MG PO CAPS: 100.0000 mg | ORAL_CAPSULE | Freq: Two times a day (BID) | ORAL | 0 refills | Status: DC | PRN

## 2021-08-29 MED ORDER — SALINE SPRAY 0.65 % NA SOLN: 1.0000 | NASAL | 0 refills | Status: DC | PRN

## 2021-08-29 NOTE — Progress Notes (Addendum)
Virtual Visit via Telephone Note  I connected with Jesse Jordan on 08/29/21 at 0920 by telephone and verified that I am speaking with the correct person using two identifiers. I spent 10 minutes talking with pt and reviewing his chart.   Location: Patient: home Provider: office   I discussed the limitations, risks, security and privacy concerns of performing an evaluation and management service by telephone and the availability of in person appointments. I also discussed with the patient that there may be a patient responsible charge related to this service. The patient expressed understanding and agreed to proceed.   History of Present Illness: Patient c/o of non productive cough, dark yellowish nasal drainage, itchy watery eyes, some sneezing, nasal drainage has some blood in it at times, symptoms started a week and half ago. Pt denies sinus pressure, body aches, fever, cp , sob, wheezing, bloody sputum,. He had nasal congestion and chest congestion that has cleared off. Robitussin helped his chest congestion, takes zyrtec daily for allergies.  No known exposure to covid , flu or TB. He has not taken flu vaccine and COVID vaccines   Observations/Objective:   Assessment and Plan: Acute cough; tessalon 100mg  BID prn  Acute sinusitis. Flonase nasal spray , 2 spray once daily                           Saline nasal spray as needed                            Continue  zyrtec 10mg  daily Follow Up Instructions:    I discussed the assessment and treatment plan with the patient. The patient was provided an opportunity to ask questions and all were answered. The patient agreed with the plan and demonstrated an understanding of the instructions.   The patient was advised to call back or seek an in-person evaluation if the symptoms worsen or if the condition fails to improve as anticipated.

## 2021-08-29 NOTE — Assessment & Plan Note (Signed)
Flonase nasal spray use  2 spray daily Saline nasal spray as needed. Zyrtec 10mg  once daily.

## 2021-08-29 NOTE — Assessment & Plan Note (Signed)
Tessalon 100mg  BID PRN for cough

## 2021-09-25 ENCOUNTER — Encounter: Payer: Self-pay | Admitting: Internal Medicine

## 2021-09-25 ENCOUNTER — Ambulatory Visit (INDEPENDENT_AMBULATORY_CARE_PROVIDER_SITE_OTHER): Payer: Medicare HMO | Admitting: Internal Medicine

## 2021-09-25 ENCOUNTER — Other Ambulatory Visit: Payer: Self-pay

## 2021-09-25 DIAGNOSIS — J0110 Acute frontal sinusitis, unspecified: Secondary | ICD-10-CM | POA: Diagnosis not present

## 2021-09-25 MED ORDER — AZITHROMYCIN 250 MG PO TABS: ORAL_TABLET | ORAL | 0 refills | Status: AC

## 2021-09-25 NOTE — Progress Notes (Signed)
Virtual Visit via Telephone Note   This visit type was conducted due to national recommendations for restrictions regarding the COVID-19 Pandemic (e.g. social distancing) in an effort to limit this patient's exposure and mitigate transmission in our community.  Due to his co-morbid illnesses, this patient is at least at moderate risk for complications without adequate follow up.  This format is felt to be most appropriate for this patient at this time.  The patient did not have access to video technology/had technical difficulties with video requiring transitioning to audio format only (telephone).  All issues noted in this document were discussed and addressed.  No physical exam could be performed with this format.  Evaluation Performed:  Follow-up visit  Date:  09/25/2021   ID:  Jesse Jordan, DOB 1951-09-18, MRN [id number]  Patient Location: Home Provider Location: Office/Clinic  Participants: Patient Location of Patient: Home Location of Provider: Telehealth Consent was obtain for visit to be over via telehealth. I verified that I am speaking with the correct person using two identifiers.  PCP:  [name], MD   Chief Complaint: Nasal congestion and cough  History of Present Illness:    Jesse Jordan is a 71 y.o. male who has a televisit for complaint of nasal congestion, postnasal drip, cough and fatigue for about last 2 to 3 weeks.  He was given Flonase and nasal saline spray about 4 weeks ago for sinusitis, which did not relieve his symptoms completely.  His symptoms are worse at bedtime.  Denies any dyspnea or wheezing currently.  He denies any fever, chills or chest pain.  The patient does not have symptoms concerning for COVID-19 infection (fever, chills, cough, or new shortness of breath).   Past Medical, Surgical, Social History, Allergies, and Medications have been Reviewed.  Past Medical History:  Diagnosis Date   Allergy 1975   Mold, pollen - shots then but now  treated by over the counter meds   Cataract 2018   Removed   Diabetes mellitus without complication ([facility]) [id number]   Type 2 - medication   GERD (gastroesophageal reflux disease) 1985   Treated by medication   Hypertension 2015   Treated by medication   Myocardial infarction ([facility]) 11/03/2019   Past Surgical History:  Procedure Laterality Date   EYE SURGERY  2018   Cataracts removed     Current Meds  Medication Sig   azithromycin (ZITHROMAX) 250 MG tablet Take 2 tablets on day 1, then 1 tablet daily on days 2 through 5     Allergies:   Amoxicillin-pot clavulanate   ROS:   Please see the history of present illness.     All other systems reviewed and are negative.   Labs/Other Tests and Data Reviewed:    Recent Labs: 08/16/2021: ALT 33; BUN 35; Creatinine, Ser 0.97; Hemoglobin 16.2; Platelets 182; Potassium 4.9; Sodium 138; TSH 2.260   Recent Lipid Panel Lab Results  Component Value Date/Time   CHOL 127 08/16/2021 09:06 AM   TRIG 135 08/16/2021 09:06 AM   HDL 35 (L) 08/16/2021 09:06 AM   CHOLHDL 3.6 08/16/2021 09:06 AM   LDLCALC 68 08/16/2021 09:06 AM    Wt Readings from Last 3 Encounters:  08/16/21 181 lb (82.1 kg)     ASSESSMENT & PLAN:    Acute sinusitis Started azithromycin as he has persistent symptoms despite symptomatic treatment Continue Flonase and Zyrtec for allergies Continue nasal saline spray as needed Advised to use vaporizer/humidifier at nighttime  Time:  Today, I have spent 9 minutes reviewing the chart, including problem list, medications, and with the patient with telehealth technology discussing the above problems.   Medication Adjustments/Labs and Tests Ordered: Current medicines are reviewed at length with the patient today.  Concerns regarding medicines are outlined above.   Tests Ordered: No orders of the defined types were placed in this encounter.   Medication Changes: Meds ordered this encounter  Medications   azithromycin  (ZITHROMAX) 250 MG tablet    Sig: Take 2 tablets on day 1, then 1 tablet daily on days 2 through 5    Dispense:  6 tablet    Refill:  0     Note: This dictation was prepared with Dragon dictation along with smaller phrase technology. Similar sounding words can be transcribed inadequately or may not be corrected upon review. Any transcriptional errors that result from this process are unintentional.      Disposition:  Follow up  Signed, [name], MD  09/25/2021 2:24 PM     [facility]

## 2021-09-25 NOTE — Telephone Encounter (Signed)
Pt made appt 09-25-21 with Jesse Jordan

## 2021-10-22 NOTE — Progress Notes (Signed)
Cardiology Office Note:   Date:  10/23/2021  NAME:  Jesse Jordan    MRN: [id number] DOB:  02/19/1951   PCP:  [name], MD  Cardiologist:  None  Electrophysiologist:  None   Referring MD: [name], MD   Chief Complaint  Patient presents with   Coronary Artery Disease    History of Present Illness:   Jesse Jordan is a 71 y.o. male with a hx of CAD, HTN, DM, HLD who is being seen today for the evaluation of CAD at the request of [name], MD. he presents to establish care with cardiology.  I did review his records from [place].  He had a non-STEMI in February 2021.  Underwent PCI to the mid left circumflex vessel.  He did have moderate disease in the other vessels that were not intervened upon.  Ejection fraction 50-55%.  He is remained on aspirin Plavix since that time.  He reports no chest pain or trouble breathing.  He goes to the [facility].  He can walk 1 to 2 miles without limitations.  He reports no history of stroke.  He is diabetic.  Most recent A1c 8.4.  LDL cholesterol 68.  He is on [place].  Working with his primary care physician on his diabetes.  EKG demonstrates sinus rhythm with a right bundle branch block.  Blood pressure is well controlled today in office 124/76.  He is a never smoker.  He does not drink alcohol or use drugs.  He relocated to the [place] area to be closer to his son.  He has 2 children.  He is currently married.  He is retired Science writer.  Problem List CAD -NSTEMI 11/04/2019 [facility]) -multivessel CAD -PCI to [facility] HTN DM -A1c 8.4 HLD -T chol 127, HDL 35, LDL 68, TG 135 5. RBBB  Past Medical History: Past Medical History:  Diagnosis Date   Allergy 1975   Mold, pollen - shots then but now treated by over the counter meds   Cataract 2018   Removed   Coronary artery disease    Diabetes mellitus without complication ([place]) [id number]   Type 2 - medication   GERD (gastroesophageal reflux disease) 1985    Treated by medication   Hypertension 2015   Treated by medication   Myocardial infarction ([place]) 11/03/2019    Past Surgical History: Past Surgical History:  Procedure Laterality Date   CARDIAC CATHETERIZATION  11/04/2019   PCI  to LCX   EYE SURGERY  2018   Cataracts removed    Current Medications: Current Meds  Medication Sig   aspirin EC 81 MG tablet Take 81 mg by mouth daily. Swallow whole.   atorvastatin (LIPITOR) 80 MG tablet Take 80 mg by mouth at bedtime.   carvedilol (COREG) 3.125 MG tablet Take 3.125 mg by mouth 2 (two) times daily.   cetirizine (ZYRTEC) 10 MG tablet Take by mouth.   ezetimibe (ZETIA) 10 MG tablet Take 10 mg by mouth daily.   FARXIGA 10 MG TABS tablet Take 1 tablet (10 mg total) by mouth daily.   fluticasone (FLONASE) 50 MCG/ACT nasal spray Place 2 sprays into both nostrils daily.   GARLIC PO Take by mouth.   Ginkgo Biloba (GNP GINGKO BILOBA EXTRACT PO) Take by mouth.   lisinopril (ZESTRIL) 20 MG tablet Take 20 mg by mouth daily.   nitroGLYCERIN (NITROSTAT) 0.4 MG SL tablet    sildenafil (VIAGRA) 50 MG tablet Take 1 tablet (50 mg total) by mouth  daily as needed for erectile dysfunction.   sodium chloride (OCEAN) 0.65 % SOLN nasal spray Place 1 spray into both nostrils as needed for congestion.   [DISCONTINUED] clopidogrel (PLAVIX) 75 MG tablet Take 75 mg by mouth daily.     Allergies:    Amoxicillin-pot clavulanate   Social History: Social History   Socioeconomic History   Marital status: Married    Spouse name: Not on file   Number of children: 2   Years of education: Not on file   Highest education level: Not on file  Occupational History   Occupation: Retired Programmer, applications  Tobacco Use   Smoking status: Never   Smokeless tobacco: Never   Tobacco comments:    Never  Vaping Use   Vaping Use: Never used  Substance and Sexual Activity   Alcohol use: Not Currently    Comment: Many years ago   Drug use: Not Currently    Types:  Marijuana    Comment: Many years ago - 1973   Sexual activity: Yes    Birth control/protection: Other-see comments, None    Comment: Active some but have ED.  Other Topics Concern   Not on file  Social History Narrative   Not on file   Social Determinants of Health   Financial Resource Strain: Not on file  Food Insecurity: Not on file  Transportation Needs: No Transportation Needs   Lack of Transportation (Medical): No   Lack of Transportation (Non-Medical): No  Physical Activity: Sufficiently Active   Days of Exercise per Week: 6 days   Minutes of Exercise per Session: 60 min  Stress: Not on file  Social Connections: Socially Integrated   Frequency of Communication with Friends and Family: More than three times a week   Frequency of Social Gatherings with Friends and Family: More than three times a week   Attends Religious Services: More than 4 times per year   Active Member of [employer] or Organizations: Yes   Attends Music therapist: More than 4 times per year   Marital Status: Married     Family History: The patient's family history includes Cancer in his father; Dementia in his mother; Heart disease in his father.  ROS:   All other ROS reviewed and negative. Pertinent positives noted in the HPI.     EKGs/Labs/Other Studies Reviewed:   The following studies were personally reviewed by me today:  EKG:  EKG is ordered today.  The ekg ordered today demonstrates sinus bradycardia heart rate 56, right bundle branch block, and was personally reviewed by me.   LHC 11/04/2019 99% mLCX-> PCI 20% left main 70% Ramus Mid LAD 40% 80% D1 20% pRCA  Recent Labs: 08/16/2021: ALT 33; BUN 35; Creatinine, Ser 0.97; Hemoglobin 16.2; Platelets 182; Potassium 4.9; Sodium 138; TSH 2.260   Recent Lipid Panel    Component Value Date/Time   CHOL 127 08/16/2021 0906   TRIG 135 08/16/2021 0906   HDL 35 (L) 08/16/2021 0906   CHOLHDL 3.6 08/16/2021 0906   LDLCALC 68  08/16/2021 0906    Physical Exam:   VS:  BP 124/76    Pulse (!) 56    Ht 5\' 10"  (1.778 m)    Wt 177 lb 3.2 oz (80.4 kg)    SpO2 95%    BMI 25.43 kg/m    Wt Readings from Last 3 Encounters:  10/23/21 177 lb 3.2 oz (80.4 kg)  08/16/21 181 lb (82.1 kg)    General: Well nourished, well  developed, in no acute distress Head: Atraumatic, normal size  Eyes: PEERLA, EOMI  Neck: Supple, no JVD Endocrine: No thryomegaly Cardiac: Normal S1, S2; RRR; no murmurs, rubs, or gallops Lungs: Clear to auscultation bilaterally, no wheezing, rhonchi or rales  Abd: Soft, nontender, no hepatomegaly  Ext: No edema, pulses 2+ Musculoskeletal: No deformities, BUE and BLE strength normal and equal Skin: Warm and dry, no rashes   Neuro: Alert and oriented to person, place, time, and situation, CNII-XII grossly intact, no focal deficits  Psych: Normal mood and affect   ASSESSMENT:   Asir Lanoue is a 71 y.o. male who presents for the following: 1. Coronary artery disease involving native coronary artery of native heart without angina pectoris   2. Mixed hyperlipidemia     PLAN:   1. Coronary artery disease involving native coronary artery of native heart without angina pectoris 2. Mixed hyperlipidemia -Non-STEMI in February 2021 in [place] though [place].  Underwent PCI to the mid left circumflex.  Found to have diffuse obstructive disease in the ramus 70% and first diagonal 80%.  These were lesions were managed medically.  He reports no chest pain or trouble breathing.  We will stop his Plavix.  Continue aspirin 81 mg daily.  Ejection fraction reported to be 50-55% in [place].  I would like for him to get an echocardiogram.  He reports no chest pains or trouble breathing.  He can walk 1 to 2 miles daily.  No need for antianginal therapy.  He will continue aspirin 80 mg daily as well as Zetia 10 mg daily.  Most recent LDL 68.  He is working with his primary care physician on his diabetes.  A1c 8.4.  I  suspect with improvement in his A1c his cholesterol will get a little better.  Would like his LDL cholesterol less than 50.  He will work with his PCP on diabetes.  He will keep [place] up-to-date on his cholesterol.  I will see him yearly.  I will follow-up the results of his echo by phone.  Disposition: Return in about 1 year (around 10/23/2022).  Medication Adjustments/Labs and Tests Ordered: Current medicines are reviewed at length with the patient today.  Concerns regarding medicines are outlined above.  Orders Placed This Encounter  Procedures   EKG 12-Lead   ECHOCARDIOGRAM COMPLETE   No orders of the defined types were placed in this encounter.   Patient Instructions  Medication Instructions:  STOP Plavix  Labwork: None today  Testing/Procedures: Your physician has requested that you have an echocardiogram. Echocardiography is a painless test that uses sound waves to create images of your heart. It provides your doctor with information about the size and shape of your heart and how well your hearts chambers and valves are working. This procedure takes approximately one hour. There are no restrictions for this procedure.   Follow-Up: 1 year  Any Other Special Instructions Will Be Listed Below (If Applicable).  If you need a refill on your cardiac medications before your next appointment, please call your pharmacy.     Signed, Addison Naegeli. Audie Box, MD, [place]  [address] [place], [place] [address] [phone]  10/23/2021 9:33 AM

## 2021-10-23 ENCOUNTER — Encounter: Payer: Self-pay | Admitting: Cardiovascular Disease

## 2021-10-23 ENCOUNTER — Ambulatory Visit: Payer: Medicare HMO | Admitting: Cardiovascular Disease

## 2021-10-23 ENCOUNTER — Other Ambulatory Visit: Payer: Self-pay

## 2021-10-23 VITALS — BP 124/76 | HR 56 | Ht 70.0 in | Wt 177.2 lb

## 2021-10-23 DIAGNOSIS — I2510 Atherosclerotic heart disease of native coronary artery without angina pectoris: Secondary | ICD-10-CM | POA: Diagnosis not present

## 2021-10-23 DIAGNOSIS — E782 Mixed hyperlipidemia: Secondary | ICD-10-CM

## 2021-10-23 NOTE — Patient Instructions (Signed)
Medication Instructions:  STOP Plavix  Labwork: None today  Testing/Procedures: Your physician has requested that you have an echocardiogram. Echocardiography is a painless test that uses sound waves to create images of your heart. It provides your doctor with information about the size and shape of your heart and how well your hearts chambers and valves are working. This procedure takes approximately one hour. There are no restrictions for this procedure.   Follow-Up: 1 year  Any Other Special Instructions Will Be Listed Below (If Applicable).  If you need a refill on your cardiac medications before your next appointment, please call your pharmacy.

## 2021-11-06 ENCOUNTER — Ambulatory Visit (INDEPENDENT_AMBULATORY_CARE_PROVIDER_SITE_OTHER): Payer: Medicare HMO

## 2021-11-06 ENCOUNTER — Telehealth: Payer: Self-pay

## 2021-11-06 ENCOUNTER — Other Ambulatory Visit: Payer: Self-pay

## 2021-11-06 DIAGNOSIS — Z111 Encounter for screening for respiratory tuberculosis: Secondary | ICD-10-CM

## 2021-11-06 NOTE — Telephone Encounter (Signed)
Health Exam Certificate   Copied Sleeved Noted

## 2021-11-08 ENCOUNTER — Other Ambulatory Visit: Payer: Self-pay

## 2021-11-08 ENCOUNTER — Ambulatory Visit: Payer: Medicare HMO

## 2021-11-08 LAB — TB SKIN TEST
Induration: NEGATIVE mm
TB Skin Test: NEGATIVE

## 2021-11-09 DIAGNOSIS — Z0279 Encounter for issue of other medical certificate: Secondary | ICD-10-CM

## 2021-11-09 NOTE — Telephone Encounter (Signed)
Patient informed forms are ready for pick up

## 2021-11-15 ENCOUNTER — Other Ambulatory Visit: Payer: Self-pay

## 2021-11-15 ENCOUNTER — Ambulatory Visit (HOSPITAL_COMMUNITY)
Admission: RE | Admit: 2021-11-15 | Discharge: 2021-11-15 | Disposition: A | Discharge status: Home / Self Care | Payer: Medicare HMO | Source: Ambulatory Visit | Attending: Cardiovascular Disease | Admitting: Cardiovascular Disease

## 2021-11-15 DIAGNOSIS — I2510 Atherosclerotic heart disease of native coronary artery without angina pectoris: Secondary | ICD-10-CM | POA: Diagnosis not present

## 2021-11-15 LAB — ECHOCARDIOGRAM COMPLETE
AR max vel: 3.54 cm2
AV Area VTI: 3.36 cm2
AV Area mean vel: 2.97 cm2
AV Mean grad: 2 mmHg
AV Peak grad: 4.2 mmHg
Ao pk vel: 1.02 m/s
Area-P 1/2: 2.09 cm2
Calc EF: 61.6 %
MV VTI: 3.82 cm2
S' Lateral: 3.1 cm
Single Plane A2C EF: 55.4 %
Single Plane A4C EF: 63.9 %

## 2021-11-15 NOTE — Progress Notes (Signed)
*  PRELIMINARY RESULTS* Echocardiogram 2D Echocardiogram has been performed.  Carolyne Fiscal 11/15/2021, 9:07 AM

## 2021-11-17 ENCOUNTER — Encounter: Payer: Self-pay | Admitting: Internal Medicine

## 2021-11-17 NOTE — Telephone Encounter (Signed)
Appt scheduled 3/1

## 2021-11-22 ENCOUNTER — Other Ambulatory Visit: Payer: Self-pay | Admitting: Internal Medicine

## 2021-11-29 DIAGNOSIS — M9907 Segmental and somatic dysfunction of upper extremity: Secondary | ICD-10-CM | POA: Diagnosis not present

## 2021-11-29 DIAGNOSIS — M25512 Pain in left shoulder: Secondary | ICD-10-CM | POA: Diagnosis not present

## 2021-11-29 DIAGNOSIS — M9902 Segmental and somatic dysfunction of thoracic region: Secondary | ICD-10-CM | POA: Diagnosis not present

## 2021-12-04 DIAGNOSIS — M9907 Segmental and somatic dysfunction of upper extremity: Secondary | ICD-10-CM | POA: Diagnosis not present

## 2021-12-04 DIAGNOSIS — M9902 Segmental and somatic dysfunction of thoracic region: Secondary | ICD-10-CM | POA: Diagnosis not present

## 2021-12-04 DIAGNOSIS — M25512 Pain in left shoulder: Secondary | ICD-10-CM | POA: Diagnosis not present

## 2021-12-08 DIAGNOSIS — M25512 Pain in left shoulder: Secondary | ICD-10-CM | POA: Diagnosis not present

## 2021-12-08 DIAGNOSIS — M9907 Segmental and somatic dysfunction of upper extremity: Secondary | ICD-10-CM | POA: Diagnosis not present

## 2021-12-08 DIAGNOSIS — M9902 Segmental and somatic dysfunction of thoracic region: Secondary | ICD-10-CM | POA: Diagnosis not present

## 2021-12-11 DIAGNOSIS — M9907 Segmental and somatic dysfunction of upper extremity: Secondary | ICD-10-CM | POA: Diagnosis not present

## 2021-12-11 DIAGNOSIS — M25512 Pain in left shoulder: Secondary | ICD-10-CM | POA: Diagnosis not present

## 2021-12-11 DIAGNOSIS — M9902 Segmental and somatic dysfunction of thoracic region: Secondary | ICD-10-CM | POA: Diagnosis not present

## 2021-12-14 ENCOUNTER — Encounter: Payer: Self-pay | Admitting: Internal Medicine

## 2021-12-14 ENCOUNTER — Other Ambulatory Visit: Payer: Self-pay

## 2021-12-14 ENCOUNTER — Ambulatory Visit (INDEPENDENT_AMBULATORY_CARE_PROVIDER_SITE_OTHER): Payer: Medicare HMO | Admitting: Internal Medicine

## 2021-12-14 VITALS — BP 122/60 | HR 62 | Resp 18 | Ht 70.0 in | Wt 176.4 lb

## 2021-12-14 DIAGNOSIS — N529 Male erectile dysfunction, unspecified: Secondary | ICD-10-CM

## 2021-12-14 DIAGNOSIS — E1169 Type 2 diabetes mellitus with other specified complication: Secondary | ICD-10-CM | POA: Diagnosis not present

## 2021-12-14 DIAGNOSIS — I2510 Atherosclerotic heart disease of native coronary artery without angina pectoris: Secondary | ICD-10-CM | POA: Diagnosis not present

## 2021-12-14 DIAGNOSIS — K219 Gastro-esophageal reflux disease without esophagitis: Secondary | ICD-10-CM

## 2021-12-14 DIAGNOSIS — E210 Primary hyperparathyroidism: Secondary | ICD-10-CM | POA: Diagnosis not present

## 2021-12-14 DIAGNOSIS — I10 Essential (primary) hypertension: Secondary | ICD-10-CM | POA: Diagnosis not present

## 2021-12-14 MED ORDER — NITROGLYCERIN 0.4 MG SL SUBL: 0.4000 mg | SUBLINGUAL_TABLET | SUBLINGUAL | 2 refills | Status: DC | PRN

## 2021-12-14 MED ORDER — SILDENAFIL CITRATE 50 MG PO TABS: 50.0000 mg | ORAL_TABLET | Freq: Every day | ORAL | 2 refills | Status: AC | PRN

## 2021-12-14 MED ORDER — NITROGLYCERIN 0.4 MG SL SUBL: 0.4000 mg | SUBLINGUAL_TABLET | SUBLINGUAL | 2 refills | Status: AC | PRN

## 2021-12-14 NOTE — Assessment & Plan Note (Signed)
BP Readings from Last 1 Encounters:  ?12/14/21 122/60  ? ?Well-controlled with lisinopril and Coreg ?Counseled for compliance with the medications ?Advised DASH diet and moderate exercise/walking, at least 150 mins/week ?

## 2021-12-14 NOTE — Assessment & Plan Note (Signed)
Could be due to medications Viagra 50 mg daily as needed 

## 2021-12-14 NOTE — Assessment & Plan Note (Signed)
Lab Results  ?Component Value Date  ? HGBA1C 8.4 (H) 08/16/2021  ? ?Uncontrolled, has tried to follow low carb diet since last visit ?With CAD, HTN and HLD ?On Farxiga 10 mg daily ?If persistently elevated HbA1C, will add Metformin ?Advised to follow diabetic diet ?On statin and ACEi ?F/u CMP and lipid panel ?Diabetic eye exam: Advised to follow up with Ophthalmology for diabetic eye exam ?

## 2021-12-14 NOTE — Assessment & Plan Note (Signed)
Chart review suggests history of primary hyperparathyroidism ?Last CMP showed elevated calcium ?Check CMP and intact PTH ?

## 2021-12-14 NOTE — Assessment & Plan Note (Addendum)
Well controlled currently with Nexium as needed ?

## 2021-12-14 NOTE — Progress Notes (Signed)
? ?Established Patient Office Visit ? ?Subjective:  ?Patient ID: Jesse Jordan, male    DOB: [birth year]  Age: 70 y.o. MRN: [id number] ? ?CC:  ?Chief Complaint  ?Patient presents with  ? Follow-up  ?  4 month follow up HTN and DM is fasting this am for labs  ? ? ?HPI ?Jesse Jordan is a 71 y.o. male with past medical history of CAD s/p stent placement (2021), HTN, GERD, type II DM with HLD who presents for f/u of his chronic medical conditions. ? ?HTN: BP is well-controlled. Takes medications regularly. Patient denies headache, dizziness, chest pain, dyspnea or palpitations. ?  ?CAD s/p stent placement in 2021: He denies any chest pain currently. He is on aspirin and statin currently. ? ?Type II DM with HLD: He is on [place] currently.  He denies any polyuria or polydipsia. His last HbA1C was 8.4 in 11/22.  He is also on Lipitor 80 mg and Zetia 10 mg daily. ? ? ?Past Medical History:  ?Diagnosis Date  ? Allergy 1975  ? Mold, pollen - shots then but now treated by over the counter meds  ? Cataract 2018  ? Removed  ? Coronary artery disease   ? Diabetes mellitus without complication ([place]) 7619  ? Type 2 - medication  ? GERD (gastroesophageal reflux disease) 1985  ? Treated by medication  ? Hypertension 2015  ? Treated by medication  ? Myocardial infarction ([place]) 11/03/2019  ? ? ?Past Surgical History:  ?Procedure Laterality Date  ? CARDIAC CATHETERIZATION  11/04/2019  ? PCI  to LCX  ? EYE SURGERY  2018  ? Cataracts removed  ? ? ?Family History  ?Problem Relation Age of Onset  ? Dementia Mother   ? Cancer Father   ? Heart disease Father   ? ? ?Social History  ? ?Socioeconomic History  ? Marital status: Married  ?  Spouse name: Not on file  ? Number of children: 2  ? Years of education: Not on file  ? Highest education level: Not on file  ?Occupational History  ? Occupation: Retired Programmer, applications  ?Tobacco Use  ? Smoking status: Never  ? Smokeless tobacco: Never  ? Tobacco comments:  ?  Never  ?Vaping Use  ? Vaping Use: Never  used  ?Substance and Sexual Activity  ? Alcohol use: Not Currently  ?  Comment: Many years ago  ? Drug use: Not Currently  ?  Types: Marijuana  ?  Comment: Many years ago - 1973  ? Sexual activity: Yes  ?  Birth control/protection: Other-see comments, None  ?  Comment: Active some but have ED.  ?Other Topics Concern  ? Not on file  ?Social History Narrative  ? Not on file  ? ?Social Determinants of Health  ? ?Financial Resource Strain: Not on file  ?Food Insecurity: Not on file  ?Transportation Needs: No Transportation Needs  ? Lack of Transportation (Medical): No  ? Lack of Transportation (Non-Medical): No  ?Physical Activity: Sufficiently Active  ? Days of Exercise per Week: 6 days  ? Minutes of Exercise per Session: 60 min  ?Stress: Not on file  ?Social Connections: Socially Integrated  ? Frequency of Communication with Friends and Family: More than three times a week  ? Frequency of Social Gatherings with Friends and Family: More than three times a week  ? Attends Religious Services: More than 4 times per year  ? Active Member of Clubs or Organizations: Yes  ? Attends Archivist Meetings: More than  4 times per year  ? Marital Status: Married  ?Intimate Partner Violence: Not on file  ? ? ?Outpatient Medications Prior to Visit  ?Medication Sig Dispense Refill  ? aspirin EC 81 MG tablet Take 81 mg by mouth daily. Swallow whole.    ? atorvastatin (LIPITOR) 80 MG tablet Take 80 mg by mouth at bedtime.    ? carvedilol (COREG) 3.125 MG tablet TAKE 1 TABLET BY MOUTH TWICE A DAY 180 tablet 1  ? cetirizine (ZYRTEC) 10 MG tablet Take by mouth.    ? ezetimibe (ZETIA) 10 MG tablet Take 10 mg by mouth daily.    ? FARXIGA 10 MG TABS tablet Take 1 tablet (10 mg total) by mouth daily. 14 tablet 0  ? fluticasone (FLONASE) 50 MCG/ACT nasal spray Place 2 sprays into both nostrils daily. 16 g 6  ? GARLIC PO Take by mouth.    ? Ginkgo Biloba (GNP GINGKO BILOBA EXTRACT PO) Take by mouth.    ? lisinopril (ZESTRIL) 20 MG  tablet Take 20 mg by mouth daily.    ? nitroGLYCERIN (NITROSTAT) 0.4 MG SL tablet     ? sildenafil (VIAGRA) 50 MG tablet Take 1 tablet (50 mg total) by mouth daily as needed for erectile dysfunction. 30 tablet 2  ? sodium chloride (OCEAN) 0.65 % SOLN nasal spray Place 1 spray into both nostrils as needed for congestion. 30 mL 0  ? ?No facility-administered medications prior to visit.  ? ? ?Allergies  ?Allergen Reactions  ? Amoxicillin-Pot Clavulanate   ?  severe diahrea-c.diff.  ? ? ?ROS ?Review of Systems  ?Constitutional:  Negative for chills and fever.  ?HENT:  Negative for congestion and sore throat.   ?Eyes:  Negative for pain and discharge.  ?Respiratory:  Negative for cough and shortness of breath.   ?Cardiovascular:  Negative for chest pain and palpitations.  ?Gastrointestinal:  Negative for constipation, diarrhea, nausea and vomiting.  ?Endocrine: Negative for polydipsia and polyuria.  ?Genitourinary:  Negative for dysuria and hematuria.  ?Musculoskeletal:  Negative for neck pain and neck stiffness.  ?Skin:  Negative for rash.  ?Neurological:  Negative for dizziness, weakness, numbness and headaches.  ?Psychiatric/Behavioral:  Negative for agitation and behavioral problems.   ? ?  ?Objective:  ?  ?Physical Exam ?Vitals reviewed.  ?Constitutional:   ?   General: He is not in acute distress. ?   Appearance: He is not diaphoretic.  ?HENT:  ?   Head: Normocephalic and atraumatic.  ?   Nose: Nose normal.  ?   Mouth/Throat:  ?   Mouth: Mucous membranes are moist.  ?Eyes:  ?   General: No scleral icterus. ?   Extraocular Movements: Extraocular movements intact.  ?Cardiovascular:  ?   Rate and Rhythm: Normal rate and regular rhythm.  ?   Pulses: Normal pulses.  ?   Heart sounds: Normal heart sounds. No murmur heard. ?Pulmonary:  ?   Breath sounds: Normal breath sounds. No wheezing or rales.  ?Musculoskeletal:  ?   Cervical back: Neck supple. No tenderness.  ?   Right lower leg: No edema.  ?   Left lower leg: No  edema.  ?Skin: ?   General: Skin is warm.  ?   Findings: No rash.  ?Neurological:  ?   General: No focal deficit present.  ?   Mental Status: He is alert and oriented to person, place, and time.  ?   Sensory: No sensory deficit.  ?   Motor: No weakness.  ?  Psychiatric:     ?   Mood and Affect: Mood normal.     ?   Behavior: Behavior normal.  ? ? ?BP 122/60 (BP Location: Left Arm, Patient Position: Sitting, Cuff Size: Normal)   Pulse 62   Resp 18   Ht _0  (1.778 m)   Wt 176 lb 6.4 oz (80 kg)   SpO2 96%   BMI 25.31 kg/m?  ?Wt Readings from Last 3 Encounters:  ?12/14/21 176 lb 6.4 oz (80 kg)  ?10/23/21 177 lb 3.2 oz (80.4 kg)  ?08/16/21 181 lb (82.1 kg)  ? ? ?Lab Results  ?Component Value Date  ? TSH 2.260 08/16/2021  ? ?Lab Results  ?Component Value Date  ? WBC 5.5 08/16/2021  ? HGB 16.2 08/16/2021  ? HCT 47.8 08/16/2021  ? MCV 94 08/16/2021  ? PLT 182 08/16/2021  ? ?Lab Results  ?Component Value Date  ? NA 138 08/16/2021  ? K 4.9 08/16/2021  ? CO2 20 08/16/2021  ? GLUCOSE 172 (H) 08/16/2021  ? BUN 35 (H) 08/16/2021  ? CREATININE 0.97 08/16/2021  ? BILITOT 0.6 08/16/2021  ? ALKPHOS 92 08/16/2021  ? AST 25 08/16/2021  ? ALT 33 08/16/2021  ? PROT 7.6 08/16/2021  ? ALBUMIN 5.0 (H) 08/16/2021  ? CALCIUM 10.8 (H) 08/16/2021  ? EGFR 85 08/16/2021  ? ?Lab Results  ?Component Value Date  ? CHOL 127 08/16/2021  ? ?Lab Results  ?Component Value Date  ? HDL 35 (L) 08/16/2021  ? ?Lab Results  ?Component Value Date  ? [place] 68 08/16/2021  ? ?Lab Results  ?Component Value Date  ? TRIG 135 08/16/2021  ? ?Lab Results  ?Component Value Date  ? CHOLHDL 3.6 08/16/2021  ? ?Lab Results  ?Component Value Date  ? HGBA1C 8.4 (H) 08/16/2021  ? ? ?  ?Assessment & Plan:  ? ?Problem List Items Addressed This Visit   ? ?  ? Cardiovascular and Mediastinum  ? CAD (coronary artery disease)  ?  S/p stent placement ?On aspirin and statin currently ?Followed by cardiology ?  ?  ? Relevant Medications  ? sildenafil (VIAGRA) 50 MG tablet  ?  nitroGLYCERIN (NITROSTAT) 0.4 MG SL tablet  ? Essential hypertension - Primary  ?  BP Readings from Last 1 Encounters:  ?12/14/21 122/60  ?Well-controlled with lisinopril and Coreg ?Counseled for compliance with the m

## 2021-12-14 NOTE — Assessment & Plan Note (Signed)
S/p stent placement On aspirin and statin currently Followed by cardiology 

## 2021-12-14 NOTE — Patient Instructions (Signed)
Please continue taking medications as prescribed.  Please continue to follow low carb diet and perform moderate exercise/walking at least 150 mins/week. 

## 2021-12-15 DIAGNOSIS — M9902 Segmental and somatic dysfunction of thoracic region: Secondary | ICD-10-CM | POA: Diagnosis not present

## 2021-12-15 DIAGNOSIS — M9907 Segmental and somatic dysfunction of upper extremity: Secondary | ICD-10-CM | POA: Diagnosis not present

## 2021-12-15 DIAGNOSIS — M25512 Pain in left shoulder: Secondary | ICD-10-CM | POA: Diagnosis not present

## 2021-12-16 LAB — HEMOGLOBIN A1C
Est. average glucose Bld gHb Est-mCnc: 183 mg/dL
Hgb A1c MFr Bld: 8 % — ABNORMAL HIGH (ref 4.8–5.6)

## 2021-12-16 LAB — CMP14+EGFR
ALT: 35 IU/L (ref 0–44)
AST: 22 IU/L (ref 0–40)
Albumin/Globulin Ratio: 2.2 (ref 1.2–2.2)
Albumin: 4.8 g/dL (ref 3.8–4.8)
Alkaline Phosphatase: 83 IU/L (ref 44–121)
BUN/Creatinine Ratio: 25 — ABNORMAL HIGH (ref 10–24)
BUN: 26 mg/dL (ref 8–27)
Bilirubin Total: 0.7 mg/dL (ref 0.0–1.2)
CO2: 22 mmol/L (ref 20–29)
Calcium: 11 mg/dL — ABNORMAL HIGH (ref 8.6–10.2)
Chloride: 102 mmol/L (ref 96–106)
Creatinine, Ser: 1.04 mg/dL (ref 0.76–1.27)
Globulin, Total: 2.2 g/dL (ref 1.5–4.5)
Glucose: 139 mg/dL — ABNORMAL HIGH (ref 70–99)
Potassium: 5.1 mmol/L (ref 3.5–5.2)
Sodium: 138 mmol/L (ref 134–144)
Total Protein: 7 g/dL (ref 6.0–8.5)
eGFR: 78 mL/min/{1.73_m2} (ref >59)

## 2021-12-16 LAB — PARATHYROID HORMONE, INTACT (NO CA): PTH: 67 pg/mL — ABNORMAL HIGH (ref 15–65)

## 2021-12-18 ENCOUNTER — Other Ambulatory Visit: Payer: Self-pay | Admitting: *Deleted

## 2021-12-18 DIAGNOSIS — E039 Hypothyroidism, unspecified: Secondary | ICD-10-CM

## 2021-12-18 DIAGNOSIS — E210 Primary hyperparathyroidism: Secondary | ICD-10-CM

## 2021-12-25 ENCOUNTER — Encounter: Payer: Self-pay | Admitting: "Endocrinology

## 2021-12-25 ENCOUNTER — Ambulatory Visit: Payer: Medicare HMO | Admitting: "Endocrinology

## 2021-12-25 VITALS — Ht 70.0 in | Wt 175.6 lb

## 2021-12-25 DIAGNOSIS — E1165 Type 2 diabetes mellitus with hyperglycemia: Secondary | ICD-10-CM

## 2021-12-25 DIAGNOSIS — E782 Mixed hyperlipidemia: Secondary | ICD-10-CM | POA: Diagnosis not present

## 2021-12-25 DIAGNOSIS — E210 Primary hyperparathyroidism: Secondary | ICD-10-CM

## 2021-12-25 DIAGNOSIS — I10 Essential (primary) hypertension: Secondary | ICD-10-CM

## 2021-12-25 NOTE — Progress Notes (Signed)
?                                                          Endocrinology Consult Note  ?     12/25/2021, 5:56 PM ? ?Jesse Jordan is a 71 y.o.-year-old male, referred by his  Anabel Halon, MD  , for evaluation for hypercalcemia/hyperparathyroidism. ? ? ?Past Medical History:  ?Diagnosis Date  ? Allergy 1975  ? Mold, pollen - shots then but now treated by over the counter meds  ? Cataract 2018  ? Removed  ? Coronary artery disease   ? Diabetes mellitus without complication (HCC) 1995  ? Type 2 - medication  ? GERD (gastroesophageal reflux disease) 1985  ? Treated by medication  ? Hypertension 2015  ? Treated by medication  ? Myocardial infarction (HCC) 11/03/2019  ? ? ?Past Surgical History:  ?Procedure Laterality Date  ? CARDIAC CATHETERIZATION  11/04/2019  ? PCI  to LCX  ? EYE SURGERY  2018  ? Cataracts removed  ? ? ?Social History  ? ?Tobacco Use  ? Smoking status: Never  ? Smokeless tobacco: Never  ? Tobacco comments:  ?  Never  ?Vaping Use  ? Vaping Use: Never used  ?Substance Use Topics  ? Alcohol use: Not Currently  ?  Comment: Many years ago  ? Drug use: Not Currently  ?  Types: Marijuana  ?  Comment: Many years ago - 1973  ? ? ?Family History  ?Problem Relation Age of Onset  ? Hypertension Mother   ? Dementia Mother   ? Hyperlipidemia Mother   ? Cancer Father   ? Heart disease Father   ? ? ?Outpatient Encounter Medications as of 12/25/2021  ?Medication Sig  ? aspirin EC 81 MG tablet Take 81 mg by mouth daily. Swallow whole.  ? atorvastatin (LIPITOR) 80 MG tablet Take 80 mg by mouth at bedtime.  ? carvedilol (COREG) 3.125 MG tablet TAKE 1 TABLET BY MOUTH TWICE A DAY  ? cetirizine (ZYRTEC) 10 MG tablet Take by mouth.  ? ezetimibe (ZETIA) 10 MG tablet Take 10 mg by mouth daily.  ? FARXIGA 10 MG TABS tablet Take 1 tablet (10 mg total) by mouth daily.  ? fluticasone (FLONASE) 50 MCG/ACT nasal spray Place 2 sprays into both nostrils daily.  ? GARLIC PO Take by mouth.  ? Ginkgo Biloba (GNP GINGKO BILOBA EXTRACT PO)  Take by mouth.  ? lisinopril (ZESTRIL) 20 MG tablet Take 20 mg by mouth daily.  ? nitroGLYCERIN (NITROSTAT) 0.4 MG SL tablet Place 1 tablet (0.4 mg total) under the tongue every 5 (five) minutes as needed for chest pain.  ? sildenafil (VIAGRA) 50 MG tablet Take 1 tablet (50 mg total) by mouth daily as needed for erectile dysfunction.  ? ?No facility-administered encounter medications on file as of 12/25/2021.  ? ? ?Allergies  ?Allergen Reactions  ? Amoxicillin-Pot Clavulanate   ?  severe diahrea-c.diff.  ? ? ? ?HPI  ?Jesse Jordan was diagnosed with hypercalcemia in November 2022 with calcium of 10.8.  Patient has no previously known history of parathyroid, pituitary, adrenal dysfunctions; no family history of such dysfunctions.  His repeat labs in March 2023 showed higher calcium 11 with PTH of 67. ?He does not have recorded bone density. ?No prior history of fragility fractures or falls.  No history of  kidney stones. ? ?No history of CKD.  ? ?he is not on HCTZ or other thiazide therapy.  No history of  vitamin D deficiency.  ? ?he is not on calcium supplements,  he eats dairy and green, leafy, vegetables on average amounts. ? ?he does not have a family history of hypercalcemia, pituitary tumors, thyroid cancer, or osteoporosis.  ? ?I reviewed his chart and he also has a history of type 2 diabetes, history of coronary artery disease, hypertension, hyperlipidemia..  ? ? ?ROS: ? ?Constitutional: + Minimally fluctuating body weight, no fatigue, no subjective hyperthermia, no subjective hypothermia ?Eyes: no blurry vision, no xerophthalmia ?ENT: no sore throat, no nodules palpated in throat, no dysphagia/odynophagia, no hoarseness ?Cardiovascular: no Chest Pain, no Shortness of Breath, no palpitations, no leg swelling ?Respiratory: no cough, no shortness of breath  ?Gastrointestinal: no Nausea/Vomiting/Diarhhea ?Musculoskeletal: no muscle/joint aches ?Skin: no rashes ?Neurological: no tremors, no numbness, no tingling, no  dizziness ?Psychiatric: no depression, no anxiety ? ?PE: ?Ht 5\' 10"  (1.778 m)   Wt 175 lb 9.6 oz (79.7 kg)   BMI 25.20 kg/m? , Body mass index is 25.2 kg/m?. ?Wt Readings from Last 3 Encounters:  ?12/25/21 175 lb 9.6 oz (79.7 kg)  ?12/14/21 176 lb 6.4 oz (80 kg)  ?10/23/21 177 lb 3.2 oz (80.4 kg)  ?  ?Constitutional: + BMI of 25 not in acute distress, normal state of mind ?Eyes: PERRLA, EOMI, no exophthalmos ?ENT: moist mucous membranes, no gross thyromegaly, no gross cervical lymphadenopathy ?Cardiovascular: normal precordial activity, Regular Rate and Rhythm, no Murmur/Rubs/Gallops ?Respiratory:  adequate breathing efforts, no gross chest deformity, Clear to auscultation bilaterally ?Gastrointestinal: abdomen soft, Non -tender, No distension, Bowel Sounds present ?Musculoskeletal: no gross deformities, strength intact in all four extremities ?Skin: moist, warm, no rashes ?Neurological: no tremor with outstretched hands, Deep tendon reflexes normal in bilateral lower extremities.   ? ? ?CMP ( most recent) ?CMP  ?   ?Component Value Date/Time  ? NA 138 12/14/2021 0849  ? K 5.1 12/14/2021 0849  ? CL 102 12/14/2021 0849  ? CO2 22 12/14/2021 0849  ? GLUCOSE 139 (H) 12/14/2021 0849  ? BUN 26 12/14/2021 0849  ? CREATININE 1.04 12/14/2021 0849  ? CALCIUM 11.0 (H) 12/14/2021 0849  ? PROT 7.0 12/14/2021 0849  ? ALBUMIN 4.8 12/14/2021 0849  ? AST 22 12/14/2021 0849  ? ALT 35 12/14/2021 0849  ? ALKPHOS 83 12/14/2021 0849  ? BILITOT 0.7 12/14/2021 0849  ? ? ? ?Diabetic Labs (most recent): ?Lab Results  ?Component Value Date  ? HGBA1C 8.0 (H) 12/14/2021  ? HGBA1C 8.4 (H) 08/16/2021  ? ? ? Lipid Panel ( most recent) ?Lipid Panel  ?   ?Component Value Date/Time  ? CHOL 127 08/16/2021 0906  ? TRIG 135 08/16/2021 0906  ? HDL 35 (L) 08/16/2021 0906  ? CHOLHDL 3.6 08/16/2021 0906  ? Allen 68 08/16/2021 0906  ? LABVLDL 24 08/16/2021 0906  ? ?  ? ?Lab Results  ?Component Value Date  ? TSH 2.260 08/16/2021  ?  ? ? ?Assessment: ?1.  Hypercalcemia / Hyperparathyroidism ? ?Plan: ?Patient has had at least 2 instances of elevated calcium,  with the highest level being at 11 mg/dL. A corresponding intact PTH level was also high, at 67.  ?- Patient does not have vitamin D deficiency.  His last Vitamin D was 48.7. ? ?- No apparent complications from hypercalcemia/hyperparathyroidism: no history of  nephrolithiasis,  osteoporosis,fragility fractures. No abdominal pain, no  major mood disorders, no bone pain. ? ?- I discussed with the patient about the physiology of calcium and parathyroid hormone, and possible  effects of  increased PTH/ Calcium , including kidney stones, cardiac dysrhythmias, osteoporosis, abdominal pain, etc.  ? ?- The work up so far is not sufficient to reach a conclusion for definitive therapy.  he  needs more studies to confirm and classify the parathyroid dysfunction he may have. ?I will proceed to obtain  24-hour urine calcium/creatinine to rule out the rare but important cause of mild elevation in calcium and PTH- Russell ( Familial Hypocalciuric Hypercalcemia), which may not require any active intervention. ?His letter blood work will include PTH RP, PTH/calcium, magnesium, phosphorus along with CMP. ? ?- I will request for his next DEXA scan to include the distal  33% of  radius for evaluation of cortical bone, which is predominantly affected by hyperparathyroidism.  ? ?Regarding his type 2 diabetes: This is his major health risk considering his history of coronary artery disease.  His recent A1c was 8%.  He is advised to continue his current medication involving Farxiga 10 mg p.o. daily. ? ?In light of his hypertension, hyperlipidemia, type 2 diabetes, coronary disease, he is a candidate for lifestyle medicine. ? ?- he acknowledges that there is a room for improvement in his food and drink choices. ?- Suggestion is made for him to avoid simple carbohydrates  from his diet including Cakes, Sweet Desserts, Ice Cream, Soda (diet and  regular), Sweet Tea, Candies, Chips, Cookies, Store Bought Juices, Alcohol , Artificial Sweeteners,  Coffee Creamer, and "Sugar-free" Products, Lemonade. This will help patient to have more stable blood gl

## 2021-12-25 NOTE — Patient Instructions (Signed)

## 2021-12-29 DIAGNOSIS — E210 Primary hyperparathyroidism: Secondary | ICD-10-CM | POA: Diagnosis not present

## 2022-01-02 ENCOUNTER — Telehealth: Payer: Self-pay | Admitting: "Endocrinology

## 2022-01-02 LAB — CREATININE, URINE, 24 HOUR
Creatinine, 24H Ur: 1362 mg/24 hr (ref 1000–2000)
Creatinine, Urine: 68.1 mg/dL

## 2022-01-02 LAB — CALCIUM, URINE, 24 HOUR
Calcium, 24H Urine: 82 mg/24 hr (ref 0–320)
Calcium, Urine: 4.1 mg/dL

## 2022-01-02 NOTE — Telephone Encounter (Signed)
Pt has a appt with [place] 4/27 and it looks like he needs a DEXA. ?

## 2022-01-03 NOTE — Telephone Encounter (Signed)
Scheduled for 01/16/22 @ 9:30. Pt notified ?

## 2022-01-10 ENCOUNTER — Encounter: Payer: Self-pay | Admitting: Internal Medicine

## 2022-01-10 ENCOUNTER — Ambulatory Visit (INDEPENDENT_AMBULATORY_CARE_PROVIDER_SITE_OTHER): Payer: Medicare HMO | Admitting: Internal Medicine

## 2022-01-10 DIAGNOSIS — W57XXXA Bitten or stung by nonvenomous insect and other nonvenomous arthropods, initial encounter: Secondary | ICD-10-CM | POA: Diagnosis not present

## 2022-01-10 DIAGNOSIS — S30861A Insect bite (nonvenomous) of abdominal wall, initial encounter: Secondary | ICD-10-CM | POA: Diagnosis not present

## 2022-01-10 MED ORDER — DOXYCYCLINE HYCLATE 100 MG PO TABS: 100.0000 mg | ORAL_TABLET | Freq: Two times a day (BID) | ORAL | 0 refills | Status: DC

## 2022-01-10 NOTE — Progress Notes (Signed)
?  ? ?Virtual Visit via Telephone Note  ? ?This visit type was conducted due to national recommendations for restrictions regarding the COVID-19 Pandemic (e.g. social distancing) in an effort to limit this patient's exposure and mitigate transmission in our community.  Due to his co-morbid illnesses, this patient is at least at moderate risk for complications without adequate follow up.  This format is felt to be most appropriate for this patient at this time.  The patient did not have access to video technology/had technical difficulties with video requiring transitioning to audio format only (telephone).  All issues noted in this document were discussed and addressed.  No physical exam could be performed with this format. ? ?Evaluation Performed:  Follow-up visit ? ?Date:  01/10/2022  ? ?ID:  Jesse Jordan, DOB 07-21-1951, MRN [id number] ? ?Patient Location: Home ?Provider Location: Office/Clinic ? ?Participants: Patient ?Location of Patient: Home ?Location of Provider: Telehealth ?Consent was obtain for visit to be over via telehealth. ?I verified that I am speaking with the correct person using two identifiers. ? ?PCP:  Anabel Halon, MD  ? ?Chief Complaint: Tick bite ? ?History of Present Illness:   ? ?Jesse Jordan is a 71 y.o. male who has a televisit for complaint of tick bite over his abdominal wall.  He removed a tick yesterday while taking shower, but he does not recall how long the tick might have been there.  He currently denies any fever, chills or fatigue.  He has a rash over the bite area, which appears to have redness with central clearing. ? ?The patient does not have symptoms concerning for COVID-19 infection (fever, chills, cough, or new shortness of breath).  ? ?Past Medical, Surgical, Social History, Allergies, and Medications have been Reviewed. ? ?Past Medical History:  ?Diagnosis Date  ? Allergy 1975  ? Mold, pollen - shots then but now treated by over the counter meds  ? Cataract 2018  ? Removed   ? Coronary artery disease   ? Diabetes mellitus without complication (HCC) 1995  ? Type 2 - medication  ? GERD (gastroesophageal reflux disease) 1985  ? Treated by medication  ? Hypertension 2015  ? Treated by medication  ? Myocardial infarction (HCC) 11/03/2019  ? ?Past Surgical History:  ?Procedure Laterality Date  ? CARDIAC CATHETERIZATION  11/04/2019  ? PCI  to LCX  ? EYE SURGERY  2018  ? Cataracts removed  ?  ? ?Current Meds  ?Medication Sig  ? aspirin EC 81 MG tablet Take 81 mg by mouth daily. Swallow whole.  ? atorvastatin (LIPITOR) 80 MG tablet Take 80 mg by mouth at bedtime.  ? carvedilol (COREG) 3.125 MG tablet TAKE 1 TABLET BY MOUTH TWICE A DAY  ? cetirizine (ZYRTEC) 10 MG tablet Take by mouth.  ? ezetimibe (ZETIA) 10 MG tablet Take 10 mg by mouth daily.  ? FARXIGA 10 MG TABS tablet Take 1 tablet (10 mg total) by mouth daily.  ? fluticasone (FLONASE) 50 MCG/ACT nasal spray Place 2 sprays into both nostrils daily.  ? GARLIC PO Take by mouth.  ? Ginkgo Biloba (GNP GINGKO BILOBA EXTRACT PO) Take by mouth.  ? lisinopril (ZESTRIL) 20 MG tablet Take 20 mg by mouth daily.  ? nitroGLYCERIN (NITROSTAT) 0.4 MG SL tablet Place 1 tablet (0.4 mg total) under the tongue every 5 (five) minutes as needed for chest pain.  ? sildenafil (VIAGRA) 50 MG tablet Take 1 tablet (50 mg total) by mouth daily as needed for erectile dysfunction.  ?  ? ?  Allergies:   Amoxicillin-pot clavulanate  ? ?ROS:   ?Please see the history of present illness.    ? ?All other systems reviewed and are negative. ? ?Physical exam: ?Skin: Erythematous area with central clearing noted over lower left abdominal wall - from image from MyChart. ? ?Labs/Other Tests and Data Reviewed:   ? ?Recent Labs: ?08/16/2021: Hemoglobin 16.2; Platelets 182; TSH 2.260 ?12/14/2021: ALT 35; BUN 26; Creatinine, Ser 1.04; Potassium 5.1; Sodium 138  ? ?Recent Lipid Panel ?Lab Results  ?Component Value Date/Time  ? CHOL 127 08/16/2021 09:06 AM  ? TRIG 135 08/16/2021 09:06 AM   ? HDL 35 (L) 08/16/2021 09:06 AM  ? CHOLHDL 3.6 08/16/2021 09:06 AM  ? LDLCALC 68 08/16/2021 09:06 AM  ? ? ?Wt Readings from Last 3 Encounters:  ?12/25/21 175 lb 9.6 oz (79.7 kg)  ?12/14/21 176 lb 6.4 oz (80 kg)  ?10/23/21 177 lb 3.2 oz (80.4 kg)  ?  ? ?ASSESSMENT & PLAN:   ? ?Tick bite ?Noted on 04/18 ?Started empiric doxycycline as tick exposure duration not known ?Advised to keep area clean and dry ? ? ?Time:   ?Today, I have spent 9 minutes reviewing the chart, including problem list, medications, and with the patient with telehealth technology discussing the above problems. ? ? ?Medication Adjustments/Labs and Tests Ordered: ?Current medicines are reviewed at length with the patient today.  Concerns regarding medicines are outlined above.  ? ?Tests Ordered: ?No orders of the defined types were placed in this encounter. ? ? ?Medication Changes: ?No orders of the defined types were placed in this encounter. ? ? ? ?Note: This dictation was prepared with Dragon dictation along with smaller phrase technology. Similar sounding words can be transcribed inadequately or may not be corrected upon review. Any transcriptional errors that result from this process are unintentional.  ?  ? ? ?Disposition:  Follow up  ?Signed, ?Anabel Halon, MD  ?01/10/2022 4:49 PM    ? ?[facility] ?[facility] ?

## 2022-01-16 ENCOUNTER — Ambulatory Visit (HOSPITAL_COMMUNITY)
Admission: RE | Admit: 2022-01-16 | Discharge: 2022-01-16 | Disposition: A | Discharge status: Home / Self Care | Payer: Medicare HMO | Source: Ambulatory Visit | Attending: "Endocrinology | Admitting: "Endocrinology

## 2022-01-16 DIAGNOSIS — M810 Age-related osteoporosis without current pathological fracture: Secondary | ICD-10-CM | POA: Diagnosis not present

## 2022-01-16 DIAGNOSIS — E210 Primary hyperparathyroidism: Secondary | ICD-10-CM | POA: Diagnosis not present

## 2022-01-17 DIAGNOSIS — E113291 Type 2 diabetes mellitus with mild nonproliferative diabetic retinopathy without macular edema, right eye: Secondary | ICD-10-CM | POA: Diagnosis not present

## 2022-01-17 LAB — HM DIABETES EYE EXAM

## 2022-01-18 ENCOUNTER — Ambulatory Visit: Payer: Medicare HMO | Admitting: "Endocrinology

## 2022-01-18 ENCOUNTER — Encounter: Payer: Self-pay | Admitting: "Endocrinology

## 2022-01-18 VITALS — BP 110/64 | HR 60 | Ht 70.0 in | Wt 175.4 lb

## 2022-01-18 DIAGNOSIS — M816 Localized osteoporosis [Lequesne]: Secondary | ICD-10-CM | POA: Diagnosis not present

## 2022-01-18 DIAGNOSIS — E210 Primary hyperparathyroidism: Secondary | ICD-10-CM

## 2022-01-18 DIAGNOSIS — E782 Mixed hyperlipidemia: Secondary | ICD-10-CM

## 2022-01-18 DIAGNOSIS — I10 Essential (primary) hypertension: Secondary | ICD-10-CM | POA: Diagnosis not present

## 2022-01-18 DIAGNOSIS — E1165 Type 2 diabetes mellitus with hyperglycemia: Secondary | ICD-10-CM | POA: Diagnosis not present

## 2022-01-18 HISTORY — DX: Localized osteoporosis (Lequesne): M81.6

## 2022-01-18 NOTE — Patient Instructions (Signed)

## 2022-01-18 NOTE — Progress Notes (Signed)
?     01/18/2022, 10:06 AM ? ?Endocrinology follow-up note ? ?Jesse Jordan is a 71 y.o.-year-old male, referred by his  [name], MD  , for evaluation for hypercalcemia/hyperparathyroidism. ? ? ?Past Medical History:  ?Diagnosis Date  ? Allergy 1975  ? Mold, pollen - shots then but now treated by over the counter meds  ? Cataract 2018  ? Removed  ? Coronary artery disease   ? Diabetes mellitus without complication ([place]) 1287  ? Type 2 - medication  ? GERD (gastroesophageal reflux disease) 1985  ? Treated by medication  ? Hypertension 2015  ? Treated by medication  ? Localized osteoporosis without current pathological fracture 01/18/2022  ? Myocardial infarction ([place]) 11/03/2019  ? ? ?Past Surgical History:  ?Procedure Laterality Date  ? CARDIAC CATHETERIZATION  11/04/2019  ? PCI  to LCX  ? EYE SURGERY  2018  ? Cataracts removed  ? ? ?Social History  ? ?Tobacco Use  ? Smoking status: Never  ? Smokeless tobacco: Never  ? Tobacco comments:  ?  Never  ?Vaping Use  ? Vaping Use: Never used  ?Substance Use Topics  ? Alcohol use: Not Currently  ?  Comment: Many years ago  ? Drug use: Not Currently  ?  Types: Marijuana  ?  Comment: Many years ago - 1973  ? ? ?Family History  ?Problem Relation Age of Onset  ? Hypertension Mother   ? Dementia Mother   ? Hyperlipidemia Mother   ? Cancer Father   ? Heart disease Father   ? ? ?Outpatient Encounter Medications as of 01/18/2022  ?Medication Sig  ? Cholecalciferol (VITAMIN D) 50 MCG (2000 UT) tablet Take 2,000 Units by mouth daily.  ? Cyanocobalamin (VITAMIN B-12 PO) Take 1 tablet by mouth daily.  ? TURMERIC CURCUMIN PO Take 1 tablet by mouth daily.  ? aspirin EC 81 MG tablet Take 81 mg by mouth daily. Swallow whole.  ? atorvastatin (LIPITOR) 80 MG tablet Take 80 mg by mouth at bedtime.  ? carvedilol (COREG) 3.125 MG tablet TAKE 1 TABLET BY MOUTH TWICE A DAY  ? cetirizine (ZYRTEC) 10 MG tablet Take by mouth.  ? doxycycline (VIBRA-TABS) 100 MG tablet Take 1 tablet (100 mg total)  by mouth 2 (two) times daily.  ? ezetimibe (ZETIA) 10 MG tablet Take 10 mg by mouth daily.  ? FARXIGA 10 MG TABS tablet Take 1 tablet (10 mg total) by mouth daily.  ? fluticasone (FLONASE) 50 MCG/ACT nasal spray Place 2 sprays into both nostrils daily.  ? GARLIC PO Take by mouth.  ? Ginkgo Biloba (GNP GINGKO BILOBA EXTRACT PO) Take by mouth.  ? lisinopril (ZESTRIL) 20 MG tablet Take 20 mg by mouth daily.  ? nitroGLYCERIN (NITROSTAT) 0.4 MG SL tablet Place 1 tablet (0.4 mg total) under the tongue every 5 (five) minutes as needed for chest pain.  ? sildenafil (VIAGRA) 50 MG tablet Take 1 tablet (50 mg total) by mouth daily as needed for erectile dysfunction.  ? ?No facility-administered encounter medications on file as of 01/18/2022.  ? ? ?Allergies  ?Allergen Reactions  ? Amoxicillin-Pot Clavulanate   ?  severe diahrea-c.diff.  ? ? ? ?HPI  ?Jesse Jordan was diagnosed with hypercalcemia in November 2022 with calcium of 10.8.   ? ?Patient has no previously known history of parathyroid, pituitary, adrenal dysfunctions; no family history of such dysfunctions.  His repeat labs in March 2023 showed higher calcium 11 with PTH of 67. ?After his first visit,  he was sent for bone density which showed osteoporosis of the hips, spine not included due to degenerative joint disease, no osteoporosis on arms. ?His 24-hour urine calcium was not elevated-at 82. ? ?No prior history of fragility fractures or falls. No history of  kidney stones. ? ?No history of CKD.  ? ?he is not on HCTZ or other thiazide therapy.  He is on vitamin D supplement.   ? ?he is not on calcium supplements,  he eats dairy and green, leafy, vegetables on average amounts. ? ?he does not have a family history of hypercalcemia, pituitary tumors, thyroid cancer, or osteoporosis.  ? ?I reviewed his chart and he also has a history of type 2 diabetes, history of coronary artery disease, hypertension, hyperlipidemia..  ? ? ?ROS: ? ?Constitutional: + Minimally fluctuating  body weight, no fatigue, no subjective hyperthermia, no subjective hypothermia ?Eyes: no blurry vision, no xerophthalmia ?ENT: no sore throat, no nodules palpated in throat, no dysphagia/odynophagia, no hoarseness ?Cardiovascular: no Chest Pain, no Shortness of Breath, no palpitations, no leg swelling ?Respiratory: no cough, no shortness of breath  ?Gastrointestinal: no Nausea/Vomiting/Diarhhea ?Musculoskeletal: no muscle/joint aches ?Skin: no rashes ?Neurological: no tremors, no numbness, no tingling, no dizziness ?Psychiatric: no depression, no anxiety ? ?PE: ?BP 110/64   Pulse 60   Ht _0  (1.778 m)   Wt 175 lb 6.4 oz (79.6 kg)   BMI 25.17 kg/m? , Body mass index is 25.17 kg/m?. ?Wt Readings from Last 3 Encounters:  ?01/18/22 175 lb 6.4 oz (79.6 kg)  ?12/25/21 175 lb 9.6 oz (79.7 kg)  ?12/14/21 176 lb 6.4 oz (80 kg)  ?  ?Constitutional: + BMI of 25 not in acute distress, normal state of mind ?Eyes: PERRLA, EOMI, no exophthalmos ?ENT: moist mucous membranes, no gross thyromegaly, no gross cervical lymphadenopathy ? ? ?Diabetic Labs (most recent): ?Lab Results  ?Component Value Date  ? HGBA1C 8.0 (H) 12/14/2021  ? HGBA1C 8.4 (H) 08/16/2021  ? ? ? Lipid Panel ( most recent) ?Lipid Panel  ?   ?Component Value Date/Time  ? CHOL 127 08/16/2021 0906  ? TRIG 135 08/16/2021 0906  ? HDL 35 (L) 08/16/2021 0906  ? CHOLHDL 3.6 08/16/2021 0906  ? Protection 68 08/16/2021 0906  ? LABVLDL 24 08/16/2021 0906  ? ?  ? ?Lab Results  ?Component Value Date  ? TSH 2.260 08/16/2021  ?  ?Recent Results (from the past 2160 hour(s))  ?TB Skin Test     Status: Normal  ? Collection Time: 11/08/21 10:05 AM  ?Result Value Ref Range  ? TB Skin Test Negative   ? Induration negataive mm  ?ECHOCARDIOGRAM COMPLETE     Status: None  ? Collection Time: 11/15/21  9:07 AM  ?Result Value Ref Range  ? Single Plane A2C EF 55.4 %  ? Single Plane A4C EF 63.9 %  ? Calc EF 61.6 %  ? AR max vel 3.54 cm2  ? AV Area VTI 3.36 cm2  ? AV Mean grad 2.0 mmHg  ? AV  Peak grad 4.2 mmHg  ? Ao pk vel 1.02 m/s  ? AV Area mean vel 2.97 cm2  ? MV VTI 3.82 cm2  ? Area-P 1/2 2.09 cm2  ? S' Lateral 3.10 cm  ?CMP14+EGFR     Status: Abnormal  ? Collection Time: 12/14/21  8:49 AM  ?Result Value Ref Range  ? Glucose 139 (H) 70 - 99 mg/dL  ? BUN 26 8 - 27 mg/dL  ? Creatinine, Ser 1.04 0.76 -  1.27 mg/dL  ? eGFR 78 >59 mL/min/1.73  ? BUN/Creatinine Ratio 25 (H) 10 - 24  ? Sodium 138 134 - 144 mmol/L  ? Potassium 5.1 3.5 - 5.2 mmol/L  ? Chloride 102 96 - 106 mmol/L  ? CO2 22 20 - 29 mmol/L  ? Calcium 11.0 (H) 8.6 - 10.2 mg/dL  ? Total Protein 7.0 6.0 - 8.5 g/dL  ? Albumin 4.8 3.8 - 4.8 g/dL  ? Globulin, Total 2.2 1.5 - 4.5 g/dL  ? Albumin/Globulin Ratio 2.2 1.2 - 2.2  ? Bilirubin Total 0.7 0.0 - 1.2 mg/dL  ? Alkaline Phosphatase 83 44 - 121 IU/L  ? AST 22 0 - 40 IU/L  ? ALT 35 0 - 44 IU/L  ?Hemoglobin A1c     Status: Abnormal  ? Collection Time: 12/14/21  8:49 AM  ?Result Value Ref Range  ? Hgb A1c MFr Bld 8.0 (H) 4.8 - 5.6 %  ?  Comment:          Prediabetes: 5.7 - 6.4 ?         Diabetes: >6.4 ?         Glycemic control for adults with diabetes: <7.0 ?  ? Est. average glucose Bld gHb Est-mCnc 183 mg/dL  ?Parathyroid hormone, intact (no Ca)     Status: Abnormal  ? Collection Time: 12/14/21  8:49 AM  ?Result Value Ref Range  ? PTH 67 (H) 15 - 65 pg/mL  ?Calcium, urine, 24 hour     Status: None  ? Collection Time: 12/29/21 10:09 AM  ?Result Value Ref Range  ? Calcium, Urine 4.1 Not Estab. mg/dL  ? Calcium, 24H Urine 82 0 - 320 mg/24 hr  ?Creatinine, urine, 24 hour     Status: None  ? Collection Time: 12/29/21 10:12 AM  ?Result Value Ref Range  ? Creatinine, Urine 68.1 Not Estab. mg/dL  ? Creatinine, 24H Ur 1,362 1,000 - 2,000 mg/24 hr  ? ? ? ?Bone density study on January 16, 2022 ?DualFemur Neck Left 01/16/2022 69.2 N/A -2.6 0.737 g/cm2 - -  ?DualFemur Total Mean 01/16/2022 69.2 N/A -1.8 0.838 g/cm2 - - ?  ?Left Forearm Radius 33% 01/16/2022 69.2 N/A 0.6 0.857 g/cm2 - - ?ASSESSMENT: ?BMD as  determined from Femur Neck Left is 0.737 g/cm2 with a T-score ?of -2.6. This patient is considered osteoporotic by [place] ?Organization (WHO) Criteria. The scan quality is good. Lumbar spine ?was exclude

## 2022-01-19 ENCOUNTER — Encounter: Payer: Self-pay | Admitting: "Endocrinology

## 2022-01-22 ENCOUNTER — Encounter: Payer: Self-pay | Admitting: *Deleted

## 2022-01-23 NOTE — Telephone Encounter (Signed)
Dr Fransico Him, ?Can you advise on this? Patient is calling back  ?

## 2022-01-25 ENCOUNTER — Other Ambulatory Visit: Payer: Self-pay | Admitting: Internal Medicine

## 2022-01-25 DIAGNOSIS — E119 Type 2 diabetes mellitus without complications: Secondary | ICD-10-CM

## 2022-02-15 ENCOUNTER — Encounter: Payer: Self-pay | Admitting: Internal Medicine

## 2022-02-15 ENCOUNTER — Ambulatory Visit (INDEPENDENT_AMBULATORY_CARE_PROVIDER_SITE_OTHER): Payer: Medicare HMO | Admitting: Internal Medicine

## 2022-02-15 DIAGNOSIS — S30861A Insect bite (nonvenomous) of abdominal wall, initial encounter: Secondary | ICD-10-CM

## 2022-02-15 DIAGNOSIS — W57XXXA Bitten or stung by nonvenomous insect and other nonvenomous arthropods, initial encounter: Secondary | ICD-10-CM

## 2022-02-15 DIAGNOSIS — S70361A Insect bite (nonvenomous), right thigh, initial encounter: Secondary | ICD-10-CM

## 2022-02-15 MED ORDER — DOXYCYCLINE HYCLATE 100 MG PO TABS: 100.0000 mg | ORAL_TABLET | Freq: Two times a day (BID) | ORAL | 0 refills | Status: DC

## 2022-02-15 NOTE — Progress Notes (Signed)
Virtual Visit via Telephone Note   This visit type was conducted due to national recommendations for restrictions regarding the COVID-19 Pandemic (e.g. social distancing) in an effort to limit this patient's exposure and mitigate transmission in our community.  Due to his co-morbid illnesses, this patient is at least at moderate risk for complications without adequate follow up.  This format is felt to be most appropriate for this patient at this time.  The patient did not have access to video technology/had technical difficulties with video requiring transitioning to audio format only (telephone).  All issues noted in this document were discussed and addressed.  No physical exam could be performed with this format.  Evaluation Performed:  Follow-up visit  Date:  02/15/2022   ID:  Jesse Jordan, DOB [birth year], MRN [id number]  Patient Location: Home Provider Location: Office/Clinic  Participants: Patient Location of Patient: Home Location of Provider: Telehealth Consent was obtain for visit to be over via telehealth. I verified that I am speaking with the correct person using two identifiers.  PCP:  Anabel Halon, MD   Chief Complaint: Tick bite  History of Present Illness:    Jesse Jordan is a 71 y.o. male who has a televisit for complaint of tick bite over his right thigh.  He removed a tick 2 days ago on 05/23, but his rash has been growing since then. He currently denies any fever, chills or fatigue.  He has a rash over the bite area, which appears to have redness with central clearing - image reviewed from MyChart.  The patient does not have symptoms concerning for COVID-19 infection (fever, chills, cough, or new shortness of breath).   Past Medical, Surgical, Social History, Allergies, and Medications have been Reviewed.  Past Medical History:  Diagnosis Date   Allergy 1975   Mold, pollen - shots then but now treated by over the counter meds   Cataract 2018   Removed    Coronary artery disease    Diabetes mellitus without complication (HCC) 1995   Type 2 - medication   GERD (gastroesophageal reflux disease) 1985   Treated by medication   Hypertension 2015   Treated by medication   Localized osteoporosis without current pathological fracture 01/18/2022   Myocardial infarction (HCC) 11/03/2019   Past Surgical History:  Procedure Laterality Date   CARDIAC CATHETERIZATION  11/04/2019   PCI  to LCX   EYE SURGERY  2018   Cataracts removed     Current Meds  Medication Sig   aspirin EC 81 MG tablet Take 81 mg by mouth daily. Swallow whole.   atorvastatin (LIPITOR) 80 MG tablet Take 80 mg by mouth at bedtime.   carvedilol (COREG) 3.125 MG tablet TAKE 1 TABLET BY MOUTH TWICE A DAY   cetirizine (ZYRTEC) 10 MG tablet Take by mouth.   Cholecalciferol (VITAMIN D) 50 MCG (2000 UT) tablet Take 2,000 Units by mouth daily.   Cyanocobalamin (VITAMIN B-12 PO) Take 1 tablet by mouth daily.   doxycycline (VIBRA-TABS) 100 MG tablet Take 1 tablet (100 mg total) by mouth 2 (two) times daily.   ezetimibe (ZETIA) 10 MG tablet TAKE 1 TABLET BY MOUTH EVERY DAY   FARXIGA 10 MG TABS tablet TAKE 1 TABLET BY MOUTH EVERY DAY   fluticasone (FLONASE) 50 MCG/ACT nasal spray Place 2 sprays into both nostrils daily.   GARLIC PO Take by mouth.   Ginkgo Biloba (GNP GINGKO BILOBA EXTRACT PO) Take by mouth.   lisinopril (ZESTRIL) 20 MG  tablet Take 20 mg by mouth daily.   nitroGLYCERIN (NITROSTAT) 0.4 MG SL tablet Place 1 tablet (0.4 mg total) under the tongue every 5 (five) minutes as needed for chest pain.   sildenafil (VIAGRA) 50 MG tablet Take 1 tablet (50 mg total) by mouth daily as needed for erectile dysfunction.   TURMERIC CURCUMIN PO Take 1 tablet by mouth daily.     Allergies:   Amoxicillin-pot clavulanate   ROS:   Please see the history of present illness.     All other systems reviewed and are negative.   Labs/Other Tests and Data Reviewed:    Recent  Labs: 08/16/2021: Hemoglobin 16.2; Platelets 182; TSH 2.260 12/14/2021: ALT 35; BUN 26; Creatinine, Ser 1.04; Potassium 5.1; Sodium 138   Recent Lipid Panel Lab Results  Component Value Date/Time   CHOL 127 08/16/2021 09:06 AM   TRIG 135 08/16/2021 09:06 AM   HDL 35 (L) 08/16/2021 09:06 AM   CHOLHDL 3.6 08/16/2021 09:06 AM   LDLCALC 68 08/16/2021 09:06 AM    Wt Readings from Last 3 Encounters:  01/18/22 175 lb 6.4 oz (79.6 kg)  12/25/21 175 lb 9.6 oz (79.7 kg)  12/14/21 176 lb 6.4 oz (80 kg)     ASSESSMENT & PLAN:    Tick bite Noted on 05/23 Rash appears like erythema migrans Started empiric doxycycline as tick exposure duration not known Advised to keep area clean and dry  Time:   Today, I have spent 8 minutes reviewing the chart, including problem list, medications, and with the patient with telehealth technology discussing the above problems.   Medication Adjustments/Labs and Tests Ordered: Current medicines are reviewed at length with the patient today.  Concerns regarding medicines are outlined above.   Tests Ordered: No orders of the defined types were placed in this encounter.   Medication Changes: No orders of the defined types were placed in this encounter.    Note: This dictation was prepared with Dragon dictation along with smaller phrase technology. Similar sounding words can be transcribed inadequately or may not be corrected upon review. Any transcriptional errors that result from this process are unintentional.      Disposition:  Follow up  Signed, Anabel Halon, MD  02/15/2022 2:01 PM     [facility]

## 2022-02-20 ENCOUNTER — Other Ambulatory Visit: Payer: Self-pay | Admitting: Internal Medicine

## 2022-02-20 DIAGNOSIS — J018 Other acute sinusitis: Secondary | ICD-10-CM

## 2022-04-09 ENCOUNTER — Other Ambulatory Visit: Payer: Self-pay | Admitting: Internal Medicine

## 2022-04-09 ENCOUNTER — Encounter: Payer: Self-pay | Admitting: Internal Medicine

## 2022-04-09 DIAGNOSIS — E559 Vitamin D deficiency, unspecified: Secondary | ICD-10-CM

## 2022-04-09 DIAGNOSIS — E782 Mixed hyperlipidemia: Secondary | ICD-10-CM

## 2022-04-09 DIAGNOSIS — Z125 Encounter for screening for malignant neoplasm of prostate: Secondary | ICD-10-CM

## 2022-04-09 DIAGNOSIS — Z0001 Encounter for general adult medical examination with abnormal findings: Secondary | ICD-10-CM

## 2022-04-09 DIAGNOSIS — E1165 Type 2 diabetes mellitus with hyperglycemia: Secondary | ICD-10-CM

## 2022-04-10 DIAGNOSIS — E1165 Type 2 diabetes mellitus with hyperglycemia: Secondary | ICD-10-CM | POA: Diagnosis not present

## 2022-04-10 DIAGNOSIS — Z0001 Encounter for general adult medical examination with abnormal findings: Secondary | ICD-10-CM | POA: Diagnosis not present

## 2022-04-10 DIAGNOSIS — E782 Mixed hyperlipidemia: Secondary | ICD-10-CM | POA: Diagnosis not present

## 2022-04-10 DIAGNOSIS — E559 Vitamin D deficiency, unspecified: Secondary | ICD-10-CM | POA: Diagnosis not present

## 2022-04-10 DIAGNOSIS — Z125 Encounter for screening for malignant neoplasm of prostate: Secondary | ICD-10-CM | POA: Diagnosis not present

## 2022-04-11 LAB — CMP14+EGFR
ALT: 28 IU/L (ref 0–44)
AST: 22 IU/L (ref 0–40)
Albumin/Globulin Ratio: 2 (ref 1.2–2.2)
Albumin: 4.6 g/dL (ref 3.9–4.9)
Alkaline Phosphatase: 90 IU/L (ref 44–121)
BUN/Creatinine Ratio: 28 — ABNORMAL HIGH (ref 10–24)
BUN: 30 mg/dL — ABNORMAL HIGH (ref 8–27)
Bilirubin Total: 0.6 mg/dL (ref 0.0–1.2)
CO2: 24 mmol/L (ref 20–29)
Calcium: 11 mg/dL — ABNORMAL HIGH (ref 8.6–10.2)
Chloride: 102 mmol/L (ref 96–106)
Creatinine, Ser: 1.09 mg/dL (ref 0.76–1.27)
Globulin, Total: 2.3 g/dL (ref 1.5–4.5)
Glucose: 147 mg/dL — ABNORMAL HIGH (ref 70–99)
Potassium: 5.2 mmol/L (ref 3.5–5.2)
Sodium: 138 mmol/L (ref 134–144)
Total Protein: 6.9 g/dL (ref 6.0–8.5)
eGFR: 73 mL/min/{1.73_m2} (ref >59)

## 2022-04-11 LAB — CBC WITH DIFFERENTIAL/PLATELET
Basophils Absolute: 0 10*3/uL (ref 0.0–0.2)
Basos: 1 %
EOS (ABSOLUTE): 0.1 10*3/uL (ref 0.0–0.4)
Eos: 3 %
Hematocrit: 45.5 % (ref 37.5–51.0)
Hemoglobin: 15.2 g/dL (ref 13.0–17.7)
Immature Grans (Abs): 0 10*3/uL (ref 0.0–0.1)
Immature Granulocytes: 0 %
Lymphocytes Absolute: 1.7 10*3/uL (ref 0.7–3.1)
Lymphs: 37 %
MCH: 32.2 pg (ref 26.6–33.0)
MCHC: 33.4 g/dL (ref 31.5–35.7)
MCV: 96 fL (ref 79–97)
Monocytes Absolute: 0.4 10*3/uL (ref 0.1–0.9)
Monocytes: 9 %
Neutrophils Absolute: 2.4 10*3/uL (ref 1.4–7.0)
Neutrophils: 50 %
Platelets: 167 10*3/uL (ref 150–450)
RBC: 4.72 x10E6/uL (ref 4.14–5.80)
RDW: 12 % (ref 11.6–15.4)
WBC: 4.7 10*3/uL (ref 3.4–10.8)

## 2022-04-11 LAB — LIPID PANEL
Chol/HDL Ratio: 3.1 ratio (ref 0.0–5.0)
Cholesterol, Total: 96 mg/dL — ABNORMAL LOW (ref 100–199)
HDL: 31 mg/dL — ABNORMAL LOW (ref >39)
LDL Chol Calc (NIH): 49 mg/dL (ref 0–99)
Triglycerides: 74 mg/dL (ref 0–149)
VLDL Cholesterol Cal: 16 mg/dL (ref 5–40)

## 2022-04-11 LAB — HEMOGLOBIN A1C
Est. average glucose Bld gHb Est-mCnc: 154 mg/dL
Hgb A1c MFr Bld: 7 % — ABNORMAL HIGH (ref 4.8–5.6)

## 2022-04-11 LAB — TSH: TSH: 3.06 u[IU]/mL (ref 0.450–4.500)

## 2022-04-11 LAB — PSA: Prostate Specific Ag, Serum: 0.5 ng/mL (ref 0.0–4.0)

## 2022-04-11 LAB — VITAMIN D 25 HYDROXY (VIT D DEFICIENCY, FRACTURES): Vit D, 25-Hydroxy: 68.6 ng/mL (ref 30.0–100.0)

## 2022-04-16 ENCOUNTER — Encounter: Payer: Self-pay | Admitting: Internal Medicine

## 2022-04-16 ENCOUNTER — Ambulatory Visit (INDEPENDENT_AMBULATORY_CARE_PROVIDER_SITE_OTHER): Payer: Medicare HMO | Admitting: Internal Medicine

## 2022-04-16 VITALS — BP 118/76 | HR 57 | Resp 18 | Ht 70.0 in | Wt 176.0 lb

## 2022-04-16 DIAGNOSIS — E210 Primary hyperparathyroidism: Secondary | ICD-10-CM | POA: Diagnosis not present

## 2022-04-16 DIAGNOSIS — E782 Mixed hyperlipidemia: Secondary | ICD-10-CM

## 2022-04-16 DIAGNOSIS — I10 Essential (primary) hypertension: Secondary | ICD-10-CM

## 2022-04-16 DIAGNOSIS — E1169 Type 2 diabetes mellitus with other specified complication: Secondary | ICD-10-CM

## 2022-04-16 DIAGNOSIS — I25118 Atherosclerotic heart disease of native coronary artery with other forms of angina pectoris: Secondary | ICD-10-CM

## 2022-04-16 MED ORDER — ATORVASTATIN CALCIUM 80 MG PO TABS: 80.0000 mg | ORAL_TABLET | Freq: Every day | ORAL | 3 refills | Status: DC

## 2022-04-16 MED ORDER — LISINOPRIL 20 MG PO TABS: 20.0000 mg | ORAL_TABLET | Freq: Every day | ORAL | 3 refills | Status: DC

## 2022-04-16 NOTE — Progress Notes (Signed)
Established Patient Office Visit  Subjective:  Patient ID: Jesse Jordan, male    DOB: 1951-03-05  Age: 71 y.o. MRN: [id number]  CC:  Chief Complaint  Patient presents with   Follow-up    Follow up     HPI Jesse Jordan is a 71 y.o. male with past medical history of CAD s/p stent placement (2021), HTN, GERD, type II DM with HLD who presents for f/u of his chronic medical conditions.  HTN: BP is well-controlled. Takes medications regularly. Patient denies headache, dizziness, chest pain, dyspnea or palpitations.   CAD s/p stent placement in 2021: He denies any chest pain currently. He is on aspirin and statin currently.   Type II DM with HLD: He is on [place] currently.  He denies any polyuria or polydipsia. His last HbA1C is 7.0 now.  He is also on Lipitor 80 mg and Zetia 10 mg daily.  Primary hyperthyroidism: His calcium level remains mildly elevated.  He sees Dr. Dorris Fetch for it.  Denies any abdominal pain, nausea, vomiting, hematuria or mood disturbance currently.   Past Medical History:  Diagnosis Date   Allergy 1975   Mold, pollen - shots then but now treated by over the counter meds   Cataract 2018   Removed   Coronary artery disease    Diabetes mellitus without complication ([place]) 2707   Type 2 - medication   GERD (gastroesophageal reflux disease) 1985   Treated by medication   Hypertension 2015   Treated by medication   Localized osteoporosis without current pathological fracture 01/18/2022   Myocardial infarction ([place]) 11/03/2019    Past Surgical History:  Procedure Laterality Date   CARDIAC CATHETERIZATION  11/04/2019   PCI  to LCX   EYE SURGERY  2018   Cataracts removed    Family History  Problem Relation Age of Onset   Hypertension Mother    Dementia Mother    Hyperlipidemia Mother    Cancer Father    Heart disease Father     Social History   Socioeconomic History   Marital status: Married    Spouse name: Not on file   Number of children: 2   Years of  education: Not on file   Highest education level: Not on file  Occupational History   Occupation: Retired Programmer, applications  Tobacco Use   Smoking status: Never   Smokeless tobacco: Never   Tobacco comments:    Never  Vaping Use   Vaping Use: Never used  Substance and Sexual Activity   Alcohol use: Not Currently    Comment: Many years ago   Drug use: Not Currently    Types: Marijuana    Comment: Many years ago - 1973   Sexual activity: Yes    Birth control/protection: Other-see comments, None    Comment: Active some but have ED.  Other Topics Concern   Not on file  Social History Narrative   Not on file   Social Determinants of Health   Financial Resource Strain: Not on file  Food Insecurity: Not on file  Transportation Needs: No Transportation Needs (08/24/2021)   PRAPARE - Hydrologist (Medical): No    Lack of Transportation (Non-Medical): No  Physical Activity: Sufficiently Active (08/24/2021)   Exercise Vital Sign    Days of Exercise per Week: 6 days    Minutes of Exercise per Session: 60 min  Stress: Not on file  Social Connections: Socially Integrated (08/24/2021)   Social Connection and Isolation  Panel [NHANES]    Frequency of Communication with Friends and Family: More than three times a week    Frequency of Social Gatherings with Friends and Family: More than three times a week    Attends Religious Services: More than 4 times per year    Active Member of [employer] or Organizations: Yes    Attends Music therapist: More than 4 times per year    Marital Status: Married  Human resources officer Violence: Not on file    Outpatient Medications Prior to Visit  Medication Sig Dispense Refill   aspirin EC 81 MG tablet Take 81 mg by mouth daily. Swallow whole.     carvedilol (COREG) 3.125 MG tablet TAKE 1 TABLET BY MOUTH TWICE A DAY 180 tablet 1   cetirizine (ZYRTEC) 10 MG tablet Take by mouth.     Cholecalciferol (VITAMIN D) 50 MCG (2000  UT) tablet Take 2,000 Units by mouth daily.     Cyanocobalamin (VITAMIN B-12 PO) Take 1 tablet by mouth daily.     FARXIGA 10 MG TABS tablet TAKE 1 TABLET BY MOUTH EVERY DAY 30 tablet 5   fluticasone (FLONASE) 50 MCG/ACT nasal spray SPRAY 2 SPRAYS INTO EACH NOSTRIL EVERY DAY 48 mL 2   GARLIC PO Take by mouth.     Ginkgo Biloba (GNP GINGKO BILOBA EXTRACT PO) Take by mouth.     nitroGLYCERIN (NITROSTAT) 0.4 MG SL tablet Place 1 tablet (0.4 mg total) under the tongue every 5 (five) minutes as needed for chest pain. 10 tablet 2   sildenafil (VIAGRA) 50 MG tablet Take 1 tablet (50 mg total) by mouth daily as needed for erectile dysfunction. 30 tablet 2   TURMERIC CURCUMIN PO Take 1 tablet by mouth daily.     atorvastatin (LIPITOR) 80 MG tablet Take 80 mg by mouth at bedtime.     ezetimibe (ZETIA) 10 MG tablet TAKE 1 TABLET BY MOUTH EVERY DAY 90 tablet 1   lisinopril (ZESTRIL) 20 MG tablet Take 20 mg by mouth daily.     doxycycline (VIBRA-TABS) 100 MG tablet Take 1 tablet (100 mg total) by mouth 2 (two) times daily. (Patient not taking: Reported on 04/16/2022) 14 tablet 0   No facility-administered medications prior to visit.    Allergies  Allergen Reactions   Amoxicillin-Pot Clavulanate     severe diahrea-c.diff.    ROS Review of Systems  Constitutional:  Negative for chills and fever.  HENT:  Negative for congestion and sore throat.   Eyes:  Negative for pain and discharge.  Respiratory:  Negative for cough and shortness of breath.   Cardiovascular:  Negative for chest pain and palpitations.  Gastrointestinal:  Negative for constipation, diarrhea, nausea and vomiting.  Endocrine: Negative for polydipsia and polyuria.  Genitourinary:  Negative for dysuria and hematuria.  Musculoskeletal:  Negative for neck pain and neck stiffness.  Skin:  Negative for rash.  Neurological:  Negative for dizziness, weakness, numbness and headaches.  Psychiatric/Behavioral:  Negative for agitation and  behavioral problems.       Objective:    Physical Exam Vitals reviewed.  Constitutional:      General: He is not in acute distress.    Appearance: He is not diaphoretic.  HENT:     Head: Normocephalic and atraumatic.     Nose: Nose normal.     Mouth/Throat:     Mouth: Mucous membranes are moist.  Eyes:     General: No scleral icterus.    Extraocular Movements: Extraocular  movements intact.  Cardiovascular:     Rate and Rhythm: Normal rate and regular rhythm.     Pulses: Normal pulses.     Heart sounds: Normal heart sounds. No murmur heard. Pulmonary:     Breath sounds: Normal breath sounds. No wheezing or rales.  Musculoskeletal:     Cervical back: Neck supple. No tenderness.     Right lower leg: No edema.     Left lower leg: No edema.  Skin:    General: Skin is warm.     Findings: No rash.  Neurological:     General: No focal deficit present.     Mental Status: He is alert and oriented to person, place, and time.     Sensory: No sensory deficit.     Motor: No weakness.  Psychiatric:        Mood and Affect: Mood normal.        Behavior: Behavior normal.     BP 118/76 (BP Location: Right Arm, Patient Position: Sitting, Cuff Size: Normal)   Pulse (!) 57   Resp 18   Ht _0  (1.778 m)   Wt 176 lb (79.8 kg)   SpO2 97%   BMI 25.25 kg/m  Wt Readings from Last 3 Encounters:  04/16/22 176 lb (79.8 kg)  01/18/22 175 lb 6.4 oz (79.6 kg)  12/25/21 175 lb 9.6 oz (79.7 kg)    Lab Results  Component Value Date   TSH 3.060 04/10/2022   Lab Results  Component Value Date   WBC 4.7 04/10/2022   HGB 15.2 04/10/2022   HCT 45.5 04/10/2022   MCV 96 04/10/2022   PLT 167 04/10/2022   Lab Results  Component Value Date   NA 138 04/10/2022   K 5.2 04/10/2022   CO2 24 04/10/2022   GLUCOSE 147 (H) 04/10/2022   BUN 30 (H) 04/10/2022   CREATININE 1.09 04/10/2022   BILITOT 0.6 04/10/2022   ALKPHOS 90 04/10/2022   AST 22 04/10/2022   ALT 28 04/10/2022   PROT 6.9  04/10/2022   ALBUMIN 4.6 04/10/2022   CALCIUM 11.0 (H) 04/10/2022   EGFR 73 04/10/2022   Lab Results  Component Value Date   CHOL 96 (L) 04/10/2022   Lab Results  Component Value Date   HDL 31 (L) 04/10/2022   Lab Results  Component Value Date   LDLCALC 49 04/10/2022   Lab Results  Component Value Date   TRIG 74 04/10/2022   Lab Results  Component Value Date   CHOLHDL 3.1 04/10/2022   Lab Results  Component Value Date   HGBA1C 7.0 (H) 04/10/2022      Assessment & Plan:   Problem List Items Addressed This Visit       Cardiovascular and Mediastinum   CAD (coronary artery disease)    S/p stent placement On aspirin and statin currently Followed by cardiology      Relevant Medications   atorvastatin (LIPITOR) 80 MG tablet   lisinopril (ZESTRIL) 20 MG tablet   Essential hypertension, benign    BP Readings from Last 1 Encounters:  04/16/22 118/76  Well-controlled with lisinopril and Coreg Counseled for compliance with the medications Advised DASH diet and moderate exercise/walking, at least 150 mins/week      Relevant Medications   atorvastatin (LIPITOR) 80 MG tablet   lisinopril (ZESTRIL) 20 MG tablet     Endocrine   Hyperparathyroidism, primary ([place])    No signs of complications currently Last CMP showed elevated calcium, but stable Followed  by Dr Dorris Fetch       Type 2 diabetes mellitus with other specified complication [facility]) - Primary    Lab Results  Component Value Date   HGBA1C 7.0 (H) 04/10/2022  Well-controlled now, has tried to follow low carb diet since last visit Associated with CAD, HTN and HLD On Farxiga 10 mg daily Advised to follow diabetic diet On statin and ACEi F/u CMP and lipid panel Diabetic eye exam: Advised to follow up with Ophthalmology for diabetic eye exam      Relevant Medications   atorvastatin (LIPITOR) 80 MG tablet   lisinopril (ZESTRIL) 20 MG tablet   Other Relevant Orders   Microalbumin / creatinine urine ratio      Other   Mixed hyperlipidemia    On Lipitor and Zetia Reviewed lipid profile - DC Zetia      Relevant Medications   atorvastatin (LIPITOR) 80 MG tablet   lisinopril (ZESTRIL) 20 MG tablet    Meds ordered this encounter  Medications   atorvastatin (LIPITOR) 80 MG tablet    Sig: Take 1 tablet (80 mg total) by mouth at bedtime.    Dispense:  90 tablet    Refill:  3   lisinopril (ZESTRIL) 20 MG tablet    Sig: Take 1 tablet (20 mg total) by mouth daily.    Dispense:  90 tablet    Refill:  3    Follow-up: Return in about 6 months (around 10/17/2022) for DM and HTN.    [name], MD

## 2022-04-16 NOTE — Patient Instructions (Addendum)
Please stop taking Ezetimibe.  Please continue taking other medications as prescribed.  Please continue to follow low carb diet and perform moderate exercise/walking at least 150 mins/week.

## 2022-04-20 NOTE — Assessment & Plan Note (Signed)
BP Readings from Last 1 Encounters:  04/16/22 118/76   Well-controlled with lisinopril and Coreg Counseled for compliance with the medications Advised DASH diet and moderate exercise/walking, at least 150 mins/week

## 2022-04-20 NOTE — Assessment & Plan Note (Signed)
Lab Results  Component Value Date   HGBA1C 7.0 (H) 04/10/2022   Well-controlled now, has tried to follow low carb diet since last visit Associated with CAD, HTN and HLD On Farxiga 10 mg daily Advised to follow diabetic diet On statin and ACEi F/u CMP and lipid panel Diabetic eye exam: Advised to follow up with Ophthalmology for diabetic eye exam

## 2022-04-20 NOTE — Assessment & Plan Note (Signed)
No signs of complications currently Last CMP showed elevated calcium, but stable Followed by Dr Fransico Him

## 2022-04-20 NOTE — Assessment & Plan Note (Signed)
S/p stent placement On aspirin and statin currently Followed by cardiology 

## 2022-04-20 NOTE — Assessment & Plan Note (Signed)
On Lipitor and Zetia Reviewed lipid profile - DC Zetia

## 2022-04-23 ENCOUNTER — Encounter: Payer: Self-pay | Admitting: Internal Medicine

## 2022-05-14 DIAGNOSIS — E210 Primary hyperparathyroidism: Secondary | ICD-10-CM | POA: Diagnosis not present

## 2022-05-14 DIAGNOSIS — M816 Localized osteoporosis [Lequesne]: Secondary | ICD-10-CM | POA: Diagnosis not present

## 2022-05-14 DIAGNOSIS — E782 Mixed hyperlipidemia: Secondary | ICD-10-CM | POA: Diagnosis not present

## 2022-05-17 ENCOUNTER — Encounter: Payer: Self-pay | Admitting: Internal Medicine

## 2022-05-18 ENCOUNTER — Other Ambulatory Visit: Payer: Self-pay | Admitting: Internal Medicine

## 2022-05-22 ENCOUNTER — Encounter: Payer: Self-pay | Admitting: "Endocrinology

## 2022-05-22 ENCOUNTER — Ambulatory Visit: Payer: Medicare HMO | Admitting: "Endocrinology

## 2022-05-22 VITALS — BP 118/66 | HR 64 | Ht 70.0 in | Wt 177.6 lb

## 2022-05-22 DIAGNOSIS — E1165 Type 2 diabetes mellitus with hyperglycemia: Secondary | ICD-10-CM | POA: Diagnosis not present

## 2022-05-22 DIAGNOSIS — M816 Localized osteoporosis [Lequesne]: Secondary | ICD-10-CM

## 2022-05-22 DIAGNOSIS — E210 Primary hyperparathyroidism: Secondary | ICD-10-CM

## 2022-05-22 DIAGNOSIS — I10 Essential (primary) hypertension: Secondary | ICD-10-CM

## 2022-05-22 DIAGNOSIS — E782 Mixed hyperlipidemia: Secondary | ICD-10-CM

## 2022-05-22 LAB — PTH, INTACT AND CALCIUM
Calcium: 10.8 mg/dL — ABNORMAL HIGH (ref 8.6–10.2)
PTH: 74 pg/mL — ABNORMAL HIGH (ref 15–65)

## 2022-05-22 LAB — PHOSPHORUS: Phosphorus: 2.9 mg/dL (ref 2.8–4.1)

## 2022-05-22 LAB — TESTOSTERONE, FREE, TOTAL, SHBG
Sex Hormone Binding: 16.3 nmol/L — ABNORMAL LOW (ref 19.3–76.4)
Testosterone, Free: 6.1 pg/mL — ABNORMAL LOW (ref 6.6–18.1)
Testosterone: 186 ng/dL — ABNORMAL LOW (ref 264–916)

## 2022-05-22 LAB — MAGNESIUM: Magnesium: 2.3 mg/dL (ref 1.6–2.3)

## 2022-05-22 MED ORDER — TESTOSTERONE 20.25 MG/ACT (1.62%) TD GEL: TRANSDERMAL | 0 refills | Status: DC

## 2022-05-22 NOTE — Patient Instructions (Signed)

## 2022-05-22 NOTE — Progress Notes (Signed)
05/22/2022, 5:37 PM  Endocrinology follow-up note  Jesse Jordan is a 71 y.o.-year-old male, referred by his  [name], MD , for evaluation for hypercalcemia/hyperparathyroidism.   Past Medical History:  Diagnosis Date   Allergy 1975   Mold, pollen - shots then but now treated by over the counter meds   Cataract 2018   Removed   Coronary artery disease    Diabetes mellitus without complication ([place]) 6945   Type 2 - medication   GERD (gastroesophageal reflux disease) 1985   Treated by medication   Hypertension 2015   Treated by medication   Localized osteoporosis without current pathological fracture 01/18/2022   Myocardial infarction ([place]) 11/03/2019    Past Surgical History:  Procedure Laterality Date   CARDIAC CATHETERIZATION  11/04/2019   PCI  to LCX   EYE SURGERY  2018   Cataracts removed    Social History   Tobacco Use   Smoking status: Never   Smokeless tobacco: Never   Tobacco comments:    Never  Vaping Use   Vaping Use: Never used  Substance Use Topics   Alcohol use: Not Currently    Comment: Many years ago   Drug use: Not Currently    Types: Marijuana    Comment: Many years ago - 1973    Family History  Problem Relation Age of Onset   Hypertension Mother    Dementia Mother    Hyperlipidemia Mother    Cancer Father    Heart disease Father     Outpatient Encounter Medications as of 05/22/2022  Medication Sig   Testosterone 20.25 MG/ACT (1.62%) GEL Apply 20.$RemoveBefo'25mg'amVZXAaPGDC$ /ACT on alternate shoulder every morning ( only one ACT per day)   aspirin EC 81 MG tablet Take 81 mg by mouth daily. Swallow whole.   atorvastatin (LIPITOR) 80 MG tablet Take 1 tablet (80 mg total) by mouth at bedtime.   carvedilol (COREG) 3.125 MG tablet TAKE 1 TABLET BY MOUTH TWICE A DAY   cetirizine (ZYRTEC) 10 MG tablet Take by mouth.   Cholecalciferol (VITAMIN D) 50 MCG (2000 UT) tablet Take 2,000 Units by mouth daily.   Cyanocobalamin (VITAMIN B-12 PO) Take 1 tablet by  mouth daily.   FARXIGA 10 MG TABS tablet TAKE 1 TABLET BY MOUTH EVERY DAY   fluticasone (FLONASE) 50 MCG/ACT nasal spray SPRAY 2 SPRAYS INTO EACH NOSTRIL EVERY DAY   GARLIC PO Take by mouth.   Ginkgo Biloba (GNP GINGKO BILOBA EXTRACT PO) Take by mouth.   lisinopril (ZESTRIL) 20 MG tablet Take 1 tablet (20 mg total) by mouth daily.   nitroGLYCERIN (NITROSTAT) 0.4 MG SL tablet Place 1 tablet (0.4 mg total) under the tongue every 5 (five) minutes as needed for chest pain.   sildenafil (VIAGRA) 50 MG tablet Take 1 tablet (50 mg total) by mouth daily as needed for erectile dysfunction.   TURMERIC CURCUMIN PO Take 1 tablet by mouth daily.   No facility-administered encounter medications on file as of 05/22/2022.    Allergies  Allergen Reactions   Amoxicillin-Pot Clavulanate     severe diahrea-c.diff.     HPI  Jesse Jordan was diagnosed with hypercalcemia in November 2022 with calcium of 10.8.    Patient has no previously known history of parathyroid, pituitary, adrenal dysfunctions; no family history of such dysfunctions.  His repeat labs in March 2023 showed higher calcium 11 with PTH of 67. His previsit labs show calcium stable at 10.8, with associated PTH was  74. His previous 24-hour urine calcium was not elevated-at 82. After his first visit, he was sent for bone density which showed osteoporosis of the hips, spine not included due to degenerative joint disease, no osteoporosis on arms. He has no new complaints today.  His previsit labs show significant hypogonadism.  He has had history of hypogonadism in the past which required testosterone supplement.  No prior history of fragility fractures or falls. No history of  kidney stones.  No history of CKD.   he is not on HCTZ or other thiazide therapy.  He is on vitamin D supplement.    he is not on calcium supplements,  he eats dairy and green, leafy, vegetables on average amounts.  he does not have a family history of hypercalcemia,  pituitary tumors, thyroid cancer, or osteoporosis.   I reviewed his chart and he also has a history of type 2 diabetes, history of coronary artery disease, hypertension, hyperlipidemia..    ROS:  Constitutional: + Minimally fluctuating body weight, no fatigue, no subjective hyperthermia, no subjective hypothermia Eyes: no blurry vision, no xerophthalmia ENT: no sore throat, no nodules palpated in throat, no dysphagia/odynophagia, no hoarseness   PE: BP 118/66   Pulse 64   Ht $R'5\' 10"'YP$  (1.778 m)   Wt 177 lb 9.6 oz (80.6 kg)   BMI 25.48 kg/m , Body mass index is 25.48 kg/m. Wt Readings from Last 3 Encounters:  05/22/22 177 lb 9.6 oz (80.6 kg)  04/16/22 176 lb (79.8 kg)  01/18/22 175 lb 6.4 oz (79.6 kg)    Constitutional: + BMI of 25 not in acute distress, normal state of mind Eyes: PERRLA, EOMI, no exophthalmos ENT: moist mucous membranes, no gross thyromegaly, no gross cervical lymphadenopathy   Diabetic Labs (most recent): Lab Results  Component Value Date   HGBA1C 7.0 (H) 04/10/2022   HGBA1C 8.0 (H) 12/14/2021   HGBA1C 8.4 (H) 08/16/2021     Lipid Panel ( most recent) Lipid Panel     Component Value Date/Time   CHOL 96 (L) 04/10/2022 0823   TRIG 74 04/10/2022 0823   HDL 31 (L) 04/10/2022 0823   CHOLHDL 3.1 04/10/2022 0823   LDLCALC 49 04/10/2022 0823   LABVLDL 16 04/10/2022 0823      Lab Results  Component Value Date   TSH 3.060 04/10/2022   TSH 2.260 08/16/2021    Recent Results (from the past 2160 hour(s))  VITAMIN D 25 Hydroxy (Vit-D Deficiency, Fractures)     Status: None   Collection Time: 04/10/22  8:23 AM  Result Value Ref Range   Vit D, 25-Hydroxy 68.6 30.0 - 100.0 ng/mL    Comment: Vitamin D deficiency has been defined by the [place] and an Endocrine Society practice guideline as a level of serum 25-OH vitamin D less than 20 ng/mL (1,2). The Endocrine Society went on to further define vitamin D insufficiency as a level between 21  and 29 ng/mL (2). 1. IOM (Institute of Medicine). 2010. Dietary reference    intakes for calcium and D. [place]: The    [employer]. 2. Holick MF, Binkley [place], Bischoff-Ferrari HA, et al.    Evaluation, treatment, and prevention of vitamin D    deficiency: an Endocrine Society clinical practice    guideline. JCEM. 2011 Jul; 96(7):1911-30.   TSH     Status: None   Collection Time: 04/10/22  8:23 AM  Result Value Ref Range   TSH 3.060 0.450 - 4.500 uIU/mL  Lipid  panel     Status: Abnormal   Collection Time: 04/10/22  8:23 AM  Result Value Ref Range   Cholesterol, Total 96 (L) 100 - 199 mg/dL   Triglycerides 74 0 - 149 mg/dL   HDL 31 (L) >39 mg/dL   VLDL Cholesterol Cal 16 5 - 40 mg/dL   LDL Chol Calc (NIH) 49 0 - 99 mg/dL   Chol/HDL Ratio 3.1 0.0 - 5.0 ratio    Comment:                                   T. Chol/HDL Ratio                                             Men  Women                               1/2 Avg.Risk  3.4    3.3                                   Avg.Risk  5.0    4.4                                2X Avg.Risk  9.6    7.1                                3X Avg.Risk 23.4   11.0   Hemoglobin A1c     Status: Abnormal   Collection Time: 04/10/22  8:23 AM  Result Value Ref Range   Hgb A1c MFr Bld 7.0 (H) 4.8 - 5.6 %    Comment:          Prediabetes: 5.7 - 6.4          Diabetes: >6.4          Glycemic control for adults with diabetes: <7.0    Est. average glucose Bld gHb Est-mCnc 154 mg/dL  CMP14+EGFR     Status: Abnormal   Collection Time: 04/10/22  8:23 AM  Result Value Ref Range   Glucose 147 (H) 70 - 99 mg/dL   BUN 30 (H) 8 - 27 mg/dL   Creatinine, Ser 1.09 0.76 - 1.27 mg/dL   eGFR 73 >59 mL/min/1.73   BUN/Creatinine Ratio 28 (H) 10 - 24   Sodium 138 134 - 144 mmol/L   Potassium 5.2 3.5 - 5.2 mmol/L   Chloride 102 96 - 106 mmol/L   CO2 24 20 - 29 mmol/L   Calcium 11.0 (H) 8.6 - 10.2 mg/dL   Total Protein 6.9 6.0 - 8.5 g/dL   Albumin 4.6  3.9 - 4.9 g/dL    Comment:               **Please note reference interval change**   Globulin, Total 2.3 1.5 - 4.5 g/dL   Albumin/Globulin Ratio 2.0 1.2 - 2.2   Bilirubin Total 0.6 0.0 - 1.2 mg/dL   Alkaline Phosphatase 90 44 - 121 IU/L   AST 22 0 - 40 IU/L   ALT 28  0 - 44 IU/L  CBC with Differential/Platelet     Status: None   Collection Time: 04/10/22  8:23 AM  Result Value Ref Range   WBC 4.7 3.4 - 10.8 x10E3/uL   RBC 4.72 4.14 - 5.80 x10E6/uL   Hemoglobin 15.2 13.0 - 17.7 g/dL   Hematocrit 00.0 38.1 - 51.0 %   MCV 96 79 - 97 fL   MCH 32.2 26.6 - 33.0 pg   MCHC 33.4 31.5 - 35.7 g/dL   RDW 90.4 09.0 - 07.6 %   Platelets 167 150 - 450 x10E3/uL   Neutrophils 50 Not Estab. %   Lymphs 37 Not Estab. %   Monocytes 9 Not Estab. %   Eos 3 Not Estab. %   Basos 1 Not Estab. %   Neutrophils Absolute 2.4 1.4 - 7.0 x10E3/uL   Lymphocytes Absolute 1.7 0.7 - 3.1 x10E3/uL   Monocytes Absolute 0.4 0.1 - 0.9 x10E3/uL   EOS (ABSOLUTE) 0.1 0.0 - 0.4 x10E3/uL   Basophils Absolute 0.0 0.0 - 0.2 x10E3/uL   Immature Granulocytes 0 Not Estab. %   Immature Grans (Abs) 0.0 0.0 - 0.1 x10E3/uL  PSA     Status: None   Collection Time: 04/10/22  8:23 AM  Result Value Ref Range   Prostate Specific Ag, Serum 0.5 0.0 - 4.0 ng/mL    Comment: Roche ECLIA methodology. According to the American Urological Association, Serum PSA should decrease and remain at undetectable levels after radical prostatectomy. The AUA defines biochemical recurrence as an initial PSA value 0.2 ng/mL or greater followed by a subsequent confirmatory PSA value 0.2 ng/mL or greater. Values obtained with different assay methods or kits cannot be used interchangeably. Results cannot be interpreted as absolute evidence of the presence or absence of malignant disease.   PTH, intact and calcium     Status: Abnormal   Collection Time: 05/14/22  8:08 AM  Result Value Ref Range   Calcium 10.8 (H) 8.6 - 10.2 mg/dL   PTH 74 (H) 15 - 65  pg/mL   PTH Interp Comment     Comment: Interpretation                 Intact PTH    Calcium                                 (pg/mL)      (mg/dL) Normal                          15 - 65     8.6 - 10.2 Primary Hyperparathyroidism         >65          >10.2 Secondary Hyperparathyroidism       >65          <10.2 Non-Parathyroid Hypercalcemia       <65          >10.2 Hypoparathyroidism                  <15          < 8.6 Non-Parathyroid Hypocalcemia    15 - 65          < 8.6   Magnesium     Status: None   Collection Time: 05/14/22  8:08 AM  Result Value Ref Range   Magnesium 2.3 1.6 - 2.3 mg/dL  Phosphorus  Status: None   Collection Time: 05/14/22  8:08 AM  Result Value Ref Range   Phosphorus 2.9 2.8 - 4.1 mg/dL  Testosterone, Free, Total, SHBG     Status: Abnormal   Collection Time: 05/14/22  8:08 AM  Result Value Ref Range   Testosterone 186 (L) 264 - 916 ng/dL    Comment: Adult male reference interval is based on a population of healthy nonobese males (BMI <30) between 49 and 100 years old. [place], [place] [phone]. PMID: [id number].    Testosterone, Free 6.1 (L) 6.6 - 18.1 pg/mL   Sex Hormone Binding 16.3 (L) 19.3 - 76.4 nmol/L     Bone density study on January 16, 2022 DualFemur Neck Left 01/16/2022 69.2 N/A -2.6 0.737 g/cm2 - -  DualFemur Total Mean 01/16/2022 69.2 N/A -1.8 0.838 g/cm2 - -   Left Forearm Radius 33% 01/16/2022 69.2 N/A 0.6 0.857 g/cm2 - - ASSESSMENT: BMD as determined from Femur Neck Left is 0.737 g/cm2 with a T-score of -2.6. This patient is considered osteoporotic by World Health Organization [facility]) Criteria. The scan quality is good. Lumbar spine was excluded due to advanced degenerative changes.     Assessment: 1. Hypercalcemia / Hyperparathyroidism  2.  Osteoporosis   3.  Type 2 diabetes      4.  Dyslipidemia  5.  Hypogonadism   Plan: We discussed his new labs with him. Patient has had at least 2 instances of elevated calcium,   with the highest level being at 11 mg/dL. A corresponding intact PTH level was also high, at 67.  His previsit labs show stable calcium at 10.8 associated with PTH of 74.  His previous 24-hour urine calcium was not elevated-at 82.  -He is on vitamin D supplement, his last Vitamin D was 48.7.  -His presentation is still consistent with mild, early primary hyperparathyroidism.  Apparent complication from hypercalcemia/hyperparathyroidism seems to be osteoporosis, no history of fragility fractures, nephrolithiasis, abdominal pain, major mood disorders. . -He may eventually need surgical treatment.  However, for the next 6 months, he will be put on observation status only.   In light of his presentation with osteoporosis, he will be offered treatment for hypogonadism with testosterone in lieu of bisphosphonates.  He wishes to be on testosterone gel 20.25 mg/ACT topically on alternating shoulders every morning with plan to measure testosterone and CBC in 4 months.    He is advised to optimize his vitamin D intake and exercise. He will need repeat bone density in 2 years.  Regarding his type 2 diabetes: This is his major health risk considering his history of coronary artery disease.  His recent A1c was 7% improving from 8%.  He is advised to continue his current medication involving Farxiga 10 mg p.o. daily.  -In light of his other metabolic syndrome manifestations including hypertension, hyperlipidemia, coronary artery disease he remains a perfect candidate for lifestyle medicine. - he acknowledges that there is a room for improvement in his food and drink choices. - Suggestion is made for him to avoid simple carbohydrates  from his diet including Cakes, Sweet Desserts, Ice Cream, Soda (diet and regular), Sweet Tea, Candies, Chips, Cookies, Store Bought Juices, Alcohol , Artificial Sweeteners,  Coffee Creamer, and "Sugar-free" Products, Lemonade. This will help patient to have more stable blood glucose  profile and potentially avoid unintended weight gain.  The following Lifestyle Medicine recommendations according to [place]  [facility]) were discussed and and offered to patient and he  agrees to start the journey:  A. Whole Foods, Plant-Based Nutrition comprising of fruits and vegetables, plant-based proteins, whole-grain carbohydrates was discussed in detail with the patient.   A list for source of those nutrients were also provided to the patient.  Patient will use only water or unsweetened tea for hydration. B.  The need to stay away from risky substances including alcohol, smoking; obtaining 7 to 9 hours of restorative sleep, at least 150 minutes of moderate intensity exercise weekly, the importance of healthy social connections,  and stress management techniques were discussed. C.  A full color page of  Calorie density of various food groups per pound showing examples of each food groups was provided to the patient.   He is advised to maintain close follow-up with his PMD.  I spent 33 minutes in the care of the patient today including review of labs from Thyroid Function, CMP, and other relevant labs ; imaging/biopsy records (current and previous including abstractions from other facilities); face-to-face time discussing  his lab results and symptoms, medications doses, his options of short and long term treatment based on the latest standards of care / guidelines;   and documenting the encounter.  Nemiah Commander  participated in the discussions, expressed understanding, and voiced agreement with the above plans.  All questions were answered to his satisfaction. he is encouraged to contact clinic should he have any questions or concerns prior to his return visit.   - Return in about 4 months (around 09/21/2022) for Fasting Labs  in AM B4 8, A1c -NV.   Glade Lloyd, MD [facility] [address] [place], [place]  [address] Phone: [phone]  Fax: [phone]    This note was partially dictated with voice recognition software. Similar sounding words can be transcribed inadequately or may not  be corrected upon review.  05/22/2022, 5:37 PM

## 2022-06-28 DIAGNOSIS — H52223 Regular astigmatism, bilateral: Secondary | ICD-10-CM | POA: Diagnosis not present

## 2022-06-28 DIAGNOSIS — H524 Presbyopia: Secondary | ICD-10-CM | POA: Diagnosis not present

## 2022-08-13 ENCOUNTER — Other Ambulatory Visit: Payer: Self-pay | Admitting: Internal Medicine

## 2022-08-13 DIAGNOSIS — E119 Type 2 diabetes mellitus without complications: Secondary | ICD-10-CM

## 2022-09-21 ENCOUNTER — Ambulatory Visit: Payer: Medicare HMO | Admitting: "Endocrinology

## 2022-09-27 DIAGNOSIS — E210 Primary hyperparathyroidism: Secondary | ICD-10-CM | POA: Diagnosis not present

## 2022-09-28 NOTE — Patient Instructions (Signed)
Health Maintenance, Male Adopting a healthy lifestyle and getting preventive care are important in promoting health and wellness. Ask your health care provider about: The right schedule for you to have regular tests and exams. Things you can do on your own to prevent diseases and keep yourself healthy. What should I know about diet, weight, and exercise? Eat a healthy diet  Eat a diet that includes plenty of vegetables, fruits, low-fat dairy products, and lean protein. Do not eat a lot of foods that are high in solid fats, added sugars, or sodium. Maintain a healthy weight Body mass index (BMI) is a measurement that can be used to identify possible weight problems. It estimates body fat based on height and weight. Your health care provider can help determine your BMI and help you achieve or maintain a healthy weight. Get regular exercise Get regular exercise. This is one of the most important things you can do for your health. Most adults should: Exercise for at least 150 minutes each week. The exercise should increase your heart rate and make you sweat (moderate-intensity exercise). Do strengthening exercises at least twice a week. This is in addition to the moderate-intensity exercise. Spend less time sitting. Even light physical activity can be beneficial. Watch cholesterol and blood lipids Have your blood tested for lipids and cholesterol at 70 years of age, then have this test every 5 years. You may need to have your cholesterol levels checked more often if: Your lipid or cholesterol levels are high. You are older than 70 years of age. You are at high risk for heart disease. What should I know about cancer screening? Many types of cancers can be detected early and may often be prevented. Depending on your health history and family history, you may need to have cancer screening at various ages. This may include screening for: Colorectal cancer. Prostate cancer. Skin cancer. Lung  cancer. What should I know about heart disease, diabetes, and high blood pressure? Blood pressure and heart disease High blood pressure causes heart disease and increases the risk of stroke. This is more likely to develop in people who have high blood pressure readings or are overweight. Talk with your health care provider about your target blood pressure readings. Have your blood pressure checked: Every 3-5 years if you are 18-39 years of age. Every year if you are 40 years old or older. If you are between the ages of 65 and 75 and are a current or former smoker, ask your health care provider if you should have a one-time screening for abdominal aortic aneurysm (AAA). Diabetes Have regular diabetes screenings. This checks your fasting blood sugar level. Have the screening done: Once every three years after age 45 if you are at a normal weight and have a low risk for diabetes. More often and at a younger age if you are overweight or have a high risk for diabetes. What should I know about preventing infection? Hepatitis B If you have a higher risk for hepatitis B, you should be screened for this virus. Talk with your health care provider to find out if you are at risk for hepatitis B infection. Hepatitis C Blood testing is recommended for: Everyone born from 1945 through 1965. Anyone with known risk factors for hepatitis C. Sexually transmitted infections (STIs) You should be screened each year for STIs, including gonorrhea and chlamydia, if: You are sexually active and are younger than 70 years of age. You are older than 70 years of age and your   health care provider tells you that you are at risk for this type of infection. Your sexual activity has changed since you were last screened, and you are at increased risk for chlamydia or gonorrhea. Ask your health care provider if you are at risk. Ask your health care provider about whether you are at high risk for HIV. Your health care provider  may recommend a prescription medicine to help prevent HIV infection. If you choose to take medicine to prevent HIV, you should first get tested for HIV. You should then be tested every 3 months for as long as you are taking the medicine. Follow these instructions at home: Alcohol use Do not drink alcohol if your health care provider tells you not to drink. If you drink alcohol: Limit how much you have to 0-2 drinks a day. Know how much alcohol is in your drink. In the [place] one drink equals one 12 oz bottle of beer (355 mL), one 5 oz glass of wine (148 mL), or one 1 oz glass of hard liquor (44 mL). Lifestyle Do not use any products that contain nicotine or tobacco. These products include cigarettes, chewing tobacco, and vaping devices, such as e-cigarettes. If you need help quitting, ask your health care provider. Do not use street drugs. Do not share needles. Ask your health care provider for help if you need support or information about quitting drugs. General instructions Schedule regular health, dental, and eye exams. Stay current with your vaccines. Tell your health care provider if: You often feel depressed. You have ever been abused or do not feel safe at home. Summary Adopting a healthy lifestyle and getting preventive care are important in promoting health and wellness. Follow your health care provider's instructions about healthy diet, exercising, and getting tested or screened for diseases. Follow your health care provider's instructions on monitoring your cholesterol and blood pressure. This information is not intended to replace advice given to you by your health care provider. Make sure you discuss any questions you have with your health care provider. Document Revised: 01/30/2021 Document Reviewed: 01/30/2021 Elsevier Patient Education  2023 [employer].  

## 2022-09-28 NOTE — Progress Notes (Deleted)
Subjective:   Jesse Jordan is a 72 y.o. male who presents for Medicare Annual/Subsequent preventive examination.  Review of Systems     Jesse Jordan , Thank you for taking time to come for your Medicare Wellness Visit. I appreciate your ongoing commitment to your health goals. Please review the following plan we discussed and let me know if I can assist you in the future.   These are the goals we discussed:  Goals      HEMOGLOBIN A1C < 7     Patient Stated     Get diabetic eye exam- recommended yearly        This is a list of the screening recommended for you and due dates:  Health Maintenance  Topic Date Due   COVID-19 Vaccine (1) Never done   Yearly kidney health urinalysis for diabetes  Never done   DTaP/Tdap/Td vaccine (1 - Tdap) Never done   Flu Shot  Never done   Complete foot exam   08/16/2022   Hemoglobin A1C  10/11/2022   Eye exam for diabetics  01/18/2023   Yearly kidney function blood test for diabetes  04/11/2023   Medicare Annual Wellness Visit  09/29/2023   Colon Cancer Screening  09/03/2028   Hepatitis C Screening: USPSTF Recommendation to screen - Ages 18-79 yo.  Completed   HPV Vaccine  Aged Out   Pneumonia Vaccine  Discontinued   Zoster (Shingles) Vaccine  Discontinued          Objective:    There were no vitals filed for this visit. There is no height or weight on file to calculate BMI.     08/24/2021    8:07 AM  Advanced Directives  Does Patient Have a Medical Advance Directive? No    Current Medications (verified) Outpatient Encounter Medications as of 09/28/2022  Medication Sig   aspirin EC 81 MG tablet Take 81 mg by mouth daily. Swallow whole.   atorvastatin (LIPITOR) 80 MG tablet Take 1 tablet (80 mg total) by mouth at bedtime.   carvedilol (COREG) 3.125 MG tablet TAKE 1 TABLET BY MOUTH TWICE A DAY   cetirizine (ZYRTEC) 10 MG tablet Take by mouth.   Cholecalciferol (VITAMIN D) 50 MCG (2000 UT) tablet Take 2,000 Units by mouth daily.    Cyanocobalamin (VITAMIN B-12 PO) Take 1 tablet by mouth daily.   FARXIGA 10 MG TABS tablet TAKE 1 TABLET BY MOUTH EVERY DAY   fluticasone (FLONASE) 50 MCG/ACT nasal spray SPRAY 2 SPRAYS INTO EACH NOSTRIL EVERY DAY   GARLIC PO Take by mouth.   Ginkgo Biloba (GNP GINGKO BILOBA EXTRACT PO) Take by mouth.   lisinopril (ZESTRIL) 20 MG tablet Take 1 tablet (20 mg total) by mouth daily.   nitroGLYCERIN (NITROSTAT) 0.4 MG SL tablet Place 1 tablet (0.4 mg total) under the tongue every 5 (five) minutes as needed for chest pain.   sildenafil (VIAGRA) 50 MG tablet Take 1 tablet (50 mg total) by mouth daily as needed for erectile dysfunction.   Testosterone 20.25 MG/ACT (1.62%) GEL Apply 20.25mg /ACT on alternate shoulder every morning ( only one ACT per day)   TURMERIC CURCUMIN PO Take 1 tablet by mouth daily.   No facility-administered encounter medications on file as of 09/28/2022.    Allergies (verified) Amoxicillin-pot clavulanate   History: Past Medical History:  Diagnosis Date   Allergy 1975   Mold, pollen - shots then but now treated by over the counter meds   Cataract 2018   Removed  Coronary artery disease    Diabetes mellitus without complication ([place]) 0630   Type 2 - medication   GERD (gastroesophageal reflux disease) 1985   Treated by medication   Hypertension 2015   Treated by medication   Localized osteoporosis without current pathological fracture 01/18/2022   Myocardial infarction ([place]) 11/03/2019   Past Surgical History:  Procedure Laterality Date   CARDIAC CATHETERIZATION  11/04/2019   PCI  to LCX   EYE SURGERY  2018   Cataracts removed   Family History  Problem Relation Age of Onset   Hypertension Mother    Dementia Mother    Hyperlipidemia Mother    Cancer Father    Heart disease Father    Social History   Socioeconomic History   Marital status: Married    Spouse name: Not on file   Number of children: 2   Years of education: Not on file   Highest  education level: Not on file  Occupational History   Occupation: Retired Programmer, applications  Tobacco Use   Smoking status: Never   Smokeless tobacco: Never   Tobacco comments:    Never  Vaping Use   Vaping Use: Never used  Substance and Sexual Activity   Alcohol use: Not Currently    Comment: Many years ago   Drug use: Not Currently    Types: Marijuana    Comment: Many years ago - 1973   Sexual activity: Yes    Birth control/protection: Other-see comments, None    Comment: Active some but have ED.  Other Topics Concern   Not on file  Social History Narrative   Not on file   Social Determinants of Health   Financial Resource Strain: Not on file  Food Insecurity: Not on file  Transportation Needs: No Transportation Needs (08/24/2021)   PRAPARE - Hydrologist (Medical): No    Lack of Transportation (Non-Medical): No  Physical Activity: Sufficiently Active (08/24/2021)   Exercise Vital Sign    Days of Exercise per Week: 6 days    Minutes of Exercise per Session: 60 min  Stress: Not on file  Social Connections: Socially Integrated (08/24/2021)   Social Connection and Isolation Panel [NHANES]    Frequency of Communication with Friends and Family: More than three times a week    Frequency of Social Gatherings with Friends and Family: More than three times a week    Attends Religious Services: More than 4 times per year    Active Member of [employer] or Organizations: Yes    Attends Music therapist: More than 4 times per year    Marital Status: Married    Tobacco Counseling Counseling given: Not Answered Tobacco comments: Never   Clinical Intake:                 Diabetic?Nutrition Risk Assessment:  Has the patient had any N/V/D within the last 2 months?  {YES/NO:21197} Does the patient have any non-healing wounds?  {YES/NO:21197} Has the patient had any unintentional weight loss or weight gain?   {YES/NO:21197}  Diabetes:  Is the patient diabetic?  {YES/NO:21197} If diabetic, was a CBG obtained today?  {YES/NO:21197} Did the patient bring in their glucometer from home?  {YES/NO:21197} How often do you monitor your CBG's? ***.   Financial Strains and Diabetes Management:  Are you having any financial strains with the device, your supplies or your medication? {YES/NO:21197}.  Does the patient want to be seen by Chronic Care Management for  management of their diabetes?  {YES/NO:21197} Would the patient like to be referred to a Nutritionist or for Diabetic Management?  {YES/NO:21197}  Diabetic Exams:  Diabetic Eye Exam: Completed ***. Overdue for diabetic eye exam. Pt has been advised about the importance in completing this exam. A referral has been placed today. Message sent to referral coordinator for scheduling purposes. Advised pt to expect a call from office referred to regarding appt.  Diabetic Foot Exam: Completed ***. Pt has been advised about the importance in completing this exam. Pt is scheduled for diabetic foot exam on ***.           Activities of Daily Living     No data to display           Patient Care Team: Anabel Halon, MD as PCP - General (Internal Medicine)  Indicate any recent Medical Services you may have received from other than [facility] providers in the past year (date may be approximate).     Assessment:   This is a routine wellness examination for Anmol.  Hearing/Vision screen No results found.  Dietary issues and exercise activities discussed:     Goals Addressed   None   Depression Screen    04/16/2022    8:51 AM 02/15/2022   10:05 AM 01/10/2022   10:15 AM 12/14/2021    8:06 AM 09/25/2021    1:52 PM 08/29/2021    8:36 AM 08/24/2021    8:17 AM  PHQ 2/9 Scores  PHQ - 2 Score 0 0 0 0 0 0 0    Fall Risk    04/16/2022    8:50 AM 02/15/2022   10:05 AM 01/10/2022   10:15 AM 12/14/2021    8:06 AM 09/25/2021    1:52 PM  Fall Risk    Falls in the past year? 0 0 0 0 0  Number falls in past yr: 0 0 0 0 0  Injury with Fall? 0 0 0 0 0  Risk for fall due to : No Fall Risks No Fall Risks No Fall Risks No Fall Risks No Fall Risks  Follow up Falls evaluation completed Falls evaluation completed Falls evaluation completed Falls evaluation completed Falls evaluation completed    FALL RISK PREVENTION PERTAINING TO THE HOME:  Any stairs in or around the home? {YES/NO:21197} If so, are there any without handrails? {YES/NO:21197} Home free of loose throw rugs in walkways, pet beds, electrical cords, etc? {YES/NO:21197} Adequate lighting in your home to reduce risk of falls? {YES/NO:21197}  ASSISTIVE DEVICES UTILIZED TO PREVENT FALLS:  Life alert? {YES/NO:21197} Use of a cane, walker or w/c? {YES/NO:21197} Grab bars in the bathroom? {YES/NO:21197} Shower chair or bench in shower? {YES/NO:21197} Elevated toilet seat or a handicapped toilet? {YES/NO:21197}  TIMED UP AND GO:  Was the test performed? {YES/NO:21197}.  Length of time to ambulate 10 feet: *** sec.   {Appearance of XBDZ:3299242}  Cognitive Function:        08/24/2021    8:21 AM  6CIT Screen  What Year? 0 points  What month? 0 points  What time? 0 points  Count back from 20 0 points  Months in reverse 0 points  Repeat phrase 0 points  Total Score 0 points    Immunizations Immunization History  Administered Date(s) Administered   PPD Test 11/06/2021    {TDAP status:2101805}  {Flu Vaccine status:2101806}  {Pneumococcal vaccine status:2101807}  {Covid-19 vaccine status:2101808}  Qualifies for Shingles Vaccine? {YES/NO:21197}  Zostavax completed {YES/NO:21197}  {Shingrix Completed?:2101804}  Screening Tests Health Maintenance  Topic Date Due   COVID-19 Vaccine (1) Never done   Diabetic kidney evaluation - Urine ACR  Never done   DTaP/Tdap/Td (1 - Tdap) Never done   INFLUENZA VACCINE  Never done   FOOT EXAM  08/16/2022   HEMOGLOBIN A1C   10/11/2022   OPHTHALMOLOGY EXAM  01/18/2023   Diabetic kidney evaluation - eGFR measurement  04/11/2023   Medicare Annual Wellness (AWV)  09/29/2023   COLONOSCOPY (Pts 45-37yrs Insurance coverage will need to be confirmed)  09/03/2028   Hepatitis C Screening  Completed   HPV VACCINES  Aged Out   Pneumonia Vaccine 66+ Years old  Discontinued   Zoster Vaccines- Shingrix  Discontinued    Health Maintenance  Health Maintenance Due  Topic Date Due   COVID-19 Vaccine (1) Never done   Diabetic kidney evaluation - Urine ACR  Never done   DTaP/Tdap/Td (1 - Tdap) Never done   INFLUENZA VACCINE  Never done   FOOT EXAM  08/16/2022    {Colorectal cancer screening:2101809}  Lung Cancer Screening: (Low Dose CT Chest recommended if Age 66-80 years, 30 pack-year currently smoking OR have quit w/in 15years.) {DOES NOT does:27190::"does not"} qualify.   Lung Cancer Screening Referral: ***  Additional Screening:  Hepatitis C Screening: {DOES NOT does:27190::"does not"} qualify; Completed ***  Vision Screening: Recommended annual ophthalmology exams for early detection of glaucoma and other disorders of the eye. Is the patient up to date with their annual eye exam?  {YES/NO:21197} Who is the provider or what is the name of the office in which the patient attends annual eye exams? *** If pt is not established with a provider, would they like to be referred to a provider to establish care? {YES/NO:21197}.   Dental Screening: Recommended annual dental exams for proper oral hygiene  Community Resource Referral / Chronic Care Management: CRR required this visit?  {YES/NO:21197}  CCM required this visit?  {YES/NO:21197}     Plan:     I have personally reviewed and noted the following in the patient's chart:   Medical and social history Use of alcohol, tobacco or illicit drugs  Current medications and supplements including opioid prescriptions. {Opioid Prescriptions:2021299177} Functional  ability and status Nutritional status Physical activity Advanced directives List of other physicians Hospitalizations, surgeries, and ER visits in previous 12 months Vitals Screenings to include cognitive, depression, and falls Referrals and appointments  In addition, I have reviewed and discussed with patient certain preventive protocols, quality metrics, and best practice recommendations. A written personalized care plan for preventive services as well as general preventive health recommendations were provided to patient.     Jacklynn Barnacle, CMA   09/28/2022   Nurse Notes:  Mr. Mysliwiec , Thank you for taking time to come for your Medicare Wellness Visit. I appreciate your ongoing commitment to your health goals. Please review the following plan we discussed and let me know if I can assist you in the future.   These are the goals we discussed:  Goals      HEMOGLOBIN A1C < 7     Patient Stated     Get diabetic eye exam- recommended yearly        This is a list of the screening recommended for you and due dates:  Health Maintenance  Topic Date Due   COVID-19 Vaccine (1) Never done   Yearly kidney health urinalysis for diabetes  Never done   DTaP/Tdap/Td vaccine (1 - Tdap) Never done  Flu Shot  Never done   Complete foot exam   08/16/2022   Hemoglobin A1C  10/11/2022   Eye exam for diabetics  01/18/2023   Yearly kidney function blood test for diabetes  04/11/2023   Medicare Annual Wellness Visit  09/29/2023   Colon Cancer Screening  09/03/2028   Hepatitis C Screening: USPSTF Recommendation to screen - Ages 18-79 yo.  Completed   HPV Vaccine  Aged Out   Pneumonia Vaccine  Discontinued   Zoster (Shingles) Vaccine  Discontinued

## 2022-10-02 ENCOUNTER — Ambulatory Visit: Payer: Medicare HMO | Admitting: "Endocrinology

## 2022-10-02 ENCOUNTER — Encounter: Payer: Self-pay | Admitting: "Endocrinology

## 2022-10-02 VITALS — BP 114/62 | HR 64 | Ht 70.0 in | Wt 179.2 lb

## 2022-10-02 DIAGNOSIS — E782 Mixed hyperlipidemia: Secondary | ICD-10-CM

## 2022-10-02 DIAGNOSIS — E210 Primary hyperparathyroidism: Secondary | ICD-10-CM | POA: Diagnosis not present

## 2022-10-02 DIAGNOSIS — E291 Testicular hypofunction: Secondary | ICD-10-CM | POA: Diagnosis not present

## 2022-10-02 DIAGNOSIS — M816 Localized osteoporosis [Lequesne]: Secondary | ICD-10-CM | POA: Diagnosis not present

## 2022-10-02 DIAGNOSIS — I10 Essential (primary) hypertension: Secondary | ICD-10-CM | POA: Diagnosis not present

## 2022-10-02 DIAGNOSIS — E1165 Type 2 diabetes mellitus with hyperglycemia: Secondary | ICD-10-CM | POA: Diagnosis not present

## 2022-10-02 LAB — POCT GLYCOSYLATED HEMOGLOBIN (HGB A1C): HbA1c, POC (controlled diabetic range): 8.4 % — AB (ref 0.0–7.0)

## 2022-10-02 MED ORDER — TESTOSTERONE 20.25 MG/ACT (1.62%) TD GEL: TRANSDERMAL | 0 refills | Status: DC

## 2022-10-02 NOTE — Progress Notes (Signed)
10/02/2022, 4:09 PM  Endocrinology follow-up note  Jesse Jordan is a 72 y.o.-year-old male, referred by his  Anabel Halon, MD , for evaluation for hypercalcemia/hyperparathyroidism.   Past Medical History:  Diagnosis Date   Allergy 1975   Mold, pollen - shots then but now treated by over the counter meds   Cataract 2018   Removed   Coronary artery disease    Diabetes mellitus without complication (HCC) 1995   Type 2 - medication   GERD (gastroesophageal reflux disease) 1985   Treated by medication   Hypertension 2015   Treated by medication   Localized osteoporosis without current pathological fracture 01/18/2022   Myocardial infarction (HCC) 11/03/2019    Past Surgical History:  Procedure Laterality Date   CARDIAC CATHETERIZATION  11/04/2019   PCI  to LCX   EYE SURGERY  2018   Cataracts removed    Social History   Tobacco Use   Smoking status: Never   Smokeless tobacco: Never   Tobacco comments:    Never  Vaping Use   Vaping Use: Never used  Substance Use Topics   Alcohol use: Not Currently    Comment: Many years ago   Drug use: Not Currently    Types: Marijuana    Comment: Many years ago - 1973    Family History  Problem Relation Age of Onset   Hypertension Mother    Dementia Mother    Hyperlipidemia Mother    Cancer Father    Heart disease Father     Outpatient Encounter Medications as of 10/02/2022  Medication Sig   ELDERBERRY PO Take 1 tablet by mouth daily.   FIBER PO Take 2 tablets by mouth daily.   NON FORMULARY Thyroid complex 2 tablets every other day   NON FORMULARY Coenzymated B-3 1 tablet daily   NON FORMULARY Citrust Bermamot extract 1 tablet daily   NON FORMULARY Liver essentials 1 tablet daily   Thiamine HCl (VITAMIN B-1 PO) Take 1 tablet by mouth daily.   aspirin EC 81 MG tablet Take 81 mg by mouth daily. Swallow whole.   atorvastatin (LIPITOR) 80 MG tablet Take 1 tablet (80 mg total) by mouth at bedtime.   carvedilol (COREG)  3.125 MG tablet TAKE 1 TABLET BY MOUTH TWICE A DAY   cetirizine (ZYRTEC) 10 MG tablet Take by mouth.   Cholecalciferol (VITAMIN D) 50 MCG (2000 UT) tablet Take 2,000 Units by mouth daily.   Cyanocobalamin (VITAMIN B-12 PO) Take 1 tablet by mouth daily.   FARXIGA 10 MG TABS tablet TAKE 1 TABLET BY MOUTH EVERY DAY   fluticasone (FLONASE) 50 MCG/ACT nasal spray SPRAY 2 SPRAYS INTO EACH NOSTRIL EVERY DAY   GARLIC PO Take by mouth.   Ginkgo Biloba (GNP GINGKO BILOBA EXTRACT PO) Take by mouth.   lisinopril (ZESTRIL) 20 MG tablet Take 1 tablet (20 mg total) by mouth daily.   nitroGLYCERIN (NITROSTAT) 0.4 MG SL tablet Place 1 tablet (0.4 mg total) under the tongue every 5 (five) minutes as needed for chest pain.   sildenafil (VIAGRA) 50 MG tablet Take 1 tablet (50 mg total) by mouth daily as needed for erectile dysfunction.   Testosterone 20.25 MG/ACT (1.62%) GEL Apply 20.25mg /ACT on alternate shoulder every morning ( only one ACT per day)   TURMERIC CURCUMIN PO Take 1 tablet by mouth daily.   [DISCONTINUED] Testosterone 20.25 MG/ACT (1.62%) GEL Apply 20.25mg /ACT on alternate shoulder every morning ( only one ACT per day) (Patient  not taking: Reported on 10/02/2022)   No facility-administered encounter medications on file as of 10/02/2022.    Allergies  Allergen Reactions   Amoxicillin-Pot Clavulanate     severe diahrea-c.diff.     HPI  Jesse Jordan was diagnosed with hypercalcemia on follow-up since April 2023.  He is known to have hypercalcemia since November 2022.  Patient has been on observation approach.  He returns with continued hypercalcemia associated with high PTH.    Prior to his last visit, his  24-hour urine calcium was not elevated-at 82. His recent bone density showed osteoporosis of the hips, spine not included due to degenerative joint disease.  No osteoporosis of the arms.    He has no new complaints today.  His previsit labs show significant hypogonadism for which testosterone  gel was prescribed during his last visit, patient did not get this supplement for unclear reasons.  He has prior history of hypogonadism which required testosterone replacement.  No prior history of fragility fractures or falls. No history of  kidney stones.  No history of CKD.   he is not on HCTZ or other thiazide therapy.  He is on vitamin D supplement.    he is not on calcium supplements,  he eats dairy and green, leafy, vegetables on average amounts.  he does not have a family history of hypercalcemia, pituitary tumors, thyroid cancer, or osteoporosis.   I reviewed his chart and he also has a history of type 2 diabetes, history of coronary artery disease, hypertension, hyperlipidemia..    ROS:  Constitutional: + Minimally fluctuating body weight, no fatigue, no subjective hyperthermia, no subjective hypothermia Eyes: no blurry vision, no xerophthalmia ENT: no sore throat, no nodules palpated in throat, no dysphagia/odynophagia, no hoarseness   PE: BP 114/62   Pulse 64   Ht 5\' 10"  (1.778 m)   Wt 179 lb 3.2 oz (81.3 kg)   BMI 25.71 kg/m , Body mass index is 25.71 kg/m. Wt Readings from Last 3 Encounters:  10/02/22 179 lb 3.2 oz (81.3 kg)  05/22/22 177 lb 9.6 oz (80.6 kg)  04/16/22 176 lb (79.8 kg)    Constitutional: + BMI of 25 not in acute distress, normal state of mind Eyes: PERRLA, EOMI, no exophthalmos ENT: moist mucous membranes, no gross thyromegaly, no gross cervical lymphadenopathy   Diabetic Labs (most recent): Lab Results  Component Value Date   HGBA1C 8.4 (A) 10/02/2022   HGBA1C 7.0 (H) 04/10/2022   HGBA1C 8.0 (H) 12/14/2021     Lipid Panel ( most recent) Lipid Panel     Component Value Date/Time   CHOL 96 (L) 04/10/2022 0823   TRIG 74 04/10/2022 0823   HDL 31 (L) 04/10/2022 0823   CHOLHDL 3.1 04/10/2022 0823   LDLCALC 49 04/10/2022 0823   LABVLDL 16 04/10/2022 0823      Lab Results  Component Value Date   TSH 3.060 04/10/2022   TSH 2.260  08/16/2021    Recent Results (from the past 2160 hour(s))  Testosterone, Free, Total, SHBG     Status: Abnormal (Preliminary result)   Collection Time: 09/27/22  8:02 AM  Result Value Ref Range   Testosterone 134 (L) 264 - 916 ng/dL    Comment: Adult male reference interval is based on a population of healthy nonobese males (BMI <30) between 73 and 50 years old. [place], [place] [phone]. PMID: [id number].    Testosterone, Free WILL FOLLOW    Sex Hormone Binding 13.1 (L) 19.3 - 76.4 nmol/L  CBC with Differential/Platelet     Status: None   Collection Time: 09/27/22  8:02 AM  Result Value Ref Range   WBC 4.5 3.4 - 10.8 x10E3/uL   RBC 4.97 4.14 - 5.80 x10E6/uL   Hemoglobin 15.6 13.0 - 17.7 g/dL   Hematocrit 35.3 61.4 - 51.0 %   MCV 95 79 - 97 fL   MCH 31.4 26.6 - 33.0 pg   MCHC 33.1 31.5 - 35.7 g/dL   RDW 43.1 54.0 - 08.6 %   Platelets 169 150 - 450 x10E3/uL   Neutrophils 53 Not Estab. %   Lymphs 33 Not Estab. %   Monocytes 10 Not Estab. %   Eos 3 Not Estab. %   Basos 1 Not Estab. %   Neutrophils Absolute 2.5 1.4 - 7.0 x10E3/uL   Lymphocytes Absolute 1.5 0.7 - 3.1 x10E3/uL   Monocytes Absolute 0.4 0.1 - 0.9 x10E3/uL   EOS (ABSOLUTE) 0.1 0.0 - 0.4 x10E3/uL   Basophils Absolute 0.0 0.0 - 0.2 x10E3/uL   Immature Granulocytes 0 Not Estab. %   Immature Grans (Abs) 0.0 0.0 - 0.1 x10E3/uL  PTH, intact and calcium     Status: Abnormal   Collection Time: 09/27/22  8:02 AM  Result Value Ref Range   Calcium 11.3 (H) 8.6 - 10.2 mg/dL   PTH 69 (H) 15 - 65 pg/mL   PTH Interp Comment     Comment: Interpretation                 Intact PTH    Calcium                                 (pg/mL)      (mg/dL) Normal                          15 - 65     8.6 - 10.2 Primary Hyperparathyroidism         >65          >10.2 Secondary Hyperparathyroidism       >65          <10.2 Non-Parathyroid Hypercalcemia       <65          >10.2 Hypoparathyroidism                  <15          <  8.6 Non-Parathyroid Hypocalcemia    15 - 65          < 8.6   HgB A1c     Status: Abnormal   Collection Time: 10/02/22  9:19 AM  Result Value Ref Range   Hemoglobin A1C     HbA1c POC (<> result, manual entry)     HbA1c, POC (prediabetic range)     HbA1c, POC (controlled diabetic range) 8.4 (A) 0.0 - 7.0 %     Bone density study on January 16, 2022 DualFemur Neck Left 01/16/2022 69.2 N/A -2.6 0.737 g/cm2 - -  DualFemur Total Mean 01/16/2022 69.2 N/A -1.8 0.838 g/cm2 - -   Left Forearm Radius 33% 01/16/2022 69.2 N/A 0.6 0.857 g/cm2 - - ASSESSMENT: BMD as determined from Femur Neck Left is 0.737 g/cm2 with a T-score of -2.6. This patient is considered osteoporotic by World Health Organization [facility]) Criteria. The scan quality is good. Lumbar spine was excluded due to advanced degenerative changes.  Assessment: 1. Hypercalcemia / Hyperparathyroidism  2.  Osteoporosis   3.  Type 2 diabetes      4.  Dyslipidemia  5.  Hypogonadism   Plan: We discussed his new labs with him. Patient has had at least 2 instances of elevated calcium,  with the highest level being at 11.3 mg/dL. A corresponding intact PTH level was also high, at 69.  Previously had PTH of 74.   His previous 24-hour urine calcium was not elevated-at 82. He is fractional excretion of calcium is 0.116.  This is still consistent with primary hyperparathyroidism.  He has osteoporosis and significant hypercalcemia making him a candidate for parathyroidectomy.  I discussed and arranged referral to Dr. Janeann Merl for him. -He is on vitamin D supplement, his last Vitamin D was 48.7. He has osteoporosis, no history of fragility fractures, nephrolithiasis, abdominal pain, major mood disorders.   In light of his presentation with osteoporosis, he was offered treatment for hypogonadism with testosterone in lieu of bisphosphonates.  He did not get his testosterone gel for unclear reasons. This prescription will be sent again.  If his  insurance did not provide coverage for AndroGel, he will be considered for testosterone injection. His insurance does not cover testosterone, he will be considered for bisphosphonates orally.    He is advised to optimize his vitamin D intake and exercise. He will need repeat bone density in 2 years.  Regarding his type 2 diabetes: Returns with worsening A1c of 8%.  This is his major health risk considering his history of coronary artery disease.    He is advised to continue his current medication involving Farxiga 10 mg p.o. daily.  -In light of his other metabolic syndrome manifestations including hypertension, hyperlipidemia, coronary artery disease he remains a perfect candidate for lifestyle medicine. - he acknowledges that there is a room for improvement in his food and drink choices. - Suggestion is made for him to avoid simple carbohydrates  from his diet including Cakes, Sweet Desserts, Ice Cream, Soda (diet and regular), Sweet Tea, Candies, Chips, Cookies, Store Bought Juices, Alcohol , Artificial Sweeteners,  Coffee Creamer, and "Sugar-free" Products, Lemonade. This will help patient to have more stable blood glucose profile and potentially avoid unintended weight gain.  The following Lifestyle Medicine recommendations according to American College of Lifestyle Medicine  [facility]) were discussed and and offered to patient and he  agrees to start the journey:  A. Whole Foods, Plant-Based Nutrition comprising of fruits and vegetables, plant-based proteins, whole-grain carbohydrates was discussed in detail with the patient.   A list for source of those nutrients were also provided to the patient.  Patient will use only water or unsweetened tea for hydration. B.  The need to stay away from risky substances including alcohol, smoking; obtaining 7 to 9 hours of restorative sleep, at least 150 minutes of moderate intensity exercise weekly, the importance of healthy social connections,  and stress  management techniques were discussed. C.  A full color page of  Calorie density of various food groups per pound showing examples of each food groups was provided to the patient.  He is advised to maintain close follow-up with his PMD.   I spent 26 minutes in the care of the patient today including review of labs from Thyroid Function, CMP, and other relevant labs ; imaging/biopsy records (current and previous including abstractions from other facilities); face-to-face time discussing  his lab results and symptoms, medications doses, his options of short and long term treatment  based on the latest standards of care / guidelines;   and documenting the encounter.  [name]  participated in the discussions, expressed understanding, and voiced agreement with the above plans.  All questions were answered to his satisfaction. he is encouraged to contact clinic should he have any questions or concerns prior to his return visit.    - Return in about 3 months (around 01/01/2023) for F/U with Labs after Surgery.   Marquis Lunch, MD [facility] [address] [place], [place] [address] Phone: [phone]  Fax: [phone]    This note was partially dictated with voice recognition software. Similar sounding words can be transcribed inadequately or may not  be corrected upon review.  10/02/2022, 4:09 PM

## 2022-10-04 LAB — CBC WITH DIFFERENTIAL/PLATELET
Basophils Absolute: 0 10*3/uL (ref 0.0–0.2)
Basos: 1 %
EOS (ABSOLUTE): 0.1 10*3/uL (ref 0.0–0.4)
Eos: 3 %
Hematocrit: 47.2 % (ref 37.5–51.0)
Hemoglobin: 15.6 g/dL (ref 13.0–17.7)
Immature Grans (Abs): 0 10*3/uL (ref 0.0–0.1)
Immature Granulocytes: 0 %
Lymphocytes Absolute: 1.5 10*3/uL (ref 0.7–3.1)
Lymphs: 33 %
MCH: 31.4 pg (ref 26.6–33.0)
MCHC: 33.1 g/dL (ref 31.5–35.7)
MCV: 95 fL (ref 79–97)
Monocytes Absolute: 0.4 10*3/uL (ref 0.1–0.9)
Monocytes: 10 %
Neutrophils Absolute: 2.5 10*3/uL (ref 1.4–7.0)
Neutrophils: 53 %
Platelets: 169 10*3/uL (ref 150–450)
RBC: 4.97 x10E6/uL (ref 4.14–5.80)
RDW: 12.4 % (ref 11.6–15.4)
WBC: 4.5 10*3/uL (ref 3.4–10.8)

## 2022-10-04 LAB — PTH, INTACT AND CALCIUM
Calcium: 11.3 mg/dL — ABNORMAL HIGH (ref 8.6–10.2)
PTH: 69 pg/mL — ABNORMAL HIGH (ref 15–65)

## 2022-10-04 LAB — TESTOSTERONE, FREE, TOTAL, SHBG
Sex Hormone Binding: 13.1 nmol/L — ABNORMAL LOW (ref 19.3–76.4)
Testosterone, Free: 4.5 pg/mL — ABNORMAL LOW (ref 6.6–18.1)
Testosterone: 134 ng/dL — ABNORMAL LOW (ref 264–916)

## 2022-10-08 NOTE — Progress Notes (Signed)
This encounter was created in error - please disregard.

## 2022-10-15 ENCOUNTER — Encounter: Payer: Self-pay | Admitting: Internal Medicine

## 2022-10-17 ENCOUNTER — Telehealth: Payer: Self-pay

## 2022-10-17 NOTE — Telephone Encounter (Signed)
Left a message requesting pt return call to the office. ?

## 2022-10-18 ENCOUNTER — Encounter: Payer: Self-pay | Admitting: Internal Medicine

## 2022-10-18 ENCOUNTER — Ambulatory Visit (INDEPENDENT_AMBULATORY_CARE_PROVIDER_SITE_OTHER): Payer: Medicare HMO | Admitting: Internal Medicine

## 2022-10-18 ENCOUNTER — Ambulatory Visit (INDEPENDENT_AMBULATORY_CARE_PROVIDER_SITE_OTHER): Payer: Medicare HMO

## 2022-10-18 VITALS — BP 107/66 | HR 57 | Ht 70.0 in | Wt 176.0 lb

## 2022-10-18 VITALS — BP 107/66 | HR 57 | Ht 70.0 in | Wt 176.2 lb

## 2022-10-18 DIAGNOSIS — E210 Primary hyperparathyroidism: Secondary | ICD-10-CM | POA: Diagnosis not present

## 2022-10-18 DIAGNOSIS — E782 Mixed hyperlipidemia: Secondary | ICD-10-CM | POA: Diagnosis not present

## 2022-10-18 DIAGNOSIS — I10 Essential (primary) hypertension: Secondary | ICD-10-CM

## 2022-10-18 DIAGNOSIS — N529 Male erectile dysfunction, unspecified: Secondary | ICD-10-CM | POA: Diagnosis not present

## 2022-10-18 DIAGNOSIS — I25118 Atherosclerotic heart disease of native coronary artery with other forms of angina pectoris: Secondary | ICD-10-CM

## 2022-10-18 DIAGNOSIS — Z0000 Encounter for general adult medical examination without abnormal findings: Secondary | ICD-10-CM | POA: Diagnosis not present

## 2022-10-18 DIAGNOSIS — E1169 Type 2 diabetes mellitus with other specified complication: Secondary | ICD-10-CM | POA: Diagnosis not present

## 2022-10-18 DIAGNOSIS — E559 Vitamin D deficiency, unspecified: Secondary | ICD-10-CM

## 2022-10-18 MED ORDER — METFORMIN HCL ER 500 MG PO TB24: 500.0000 mg | ORAL_TABLET | Freq: Every day | ORAL | 1 refills | Status: DC

## 2022-10-18 NOTE — Progress Notes (Signed)
Established Patient Office Visit  Subjective:  Patient ID: Jesse Jordan, male    DOB: 02/05/1951  Age: 72 y.o. MRN: [id number]  CC:  Chief Complaint  Patient presents with   Diabetes    Six month follow up on diabetes and hypertension     HPI Jesse Jordan is a 72 y.o. male with past medical history of CAD s/p stent placement (2021), HTN, GERD, type II DM with HLD who presents for f/u of his chronic medical conditions.  HTN: BP is well-controlled. Takes medications regularly. Patient denies headache, dizziness, chest pain, dyspnea or palpitations.   CAD s/p stent placement in 2021: He denies any chest pain currently. He is on aspirin and statin currently.   Type II DM with HLD: He is on [place] currently.  He denies any polyuria or polydipsia. His last HbA1C is 8.4 now.  He is also on Lipitor 80 mg and Zetia 10 mg daily.  Primary hyperthyroidism: His calcium level remains mildly elevated.  He sees Dr. Fransico Him for it.  Denies any abdominal pain, nausea, vomiting, hematuria or mood disturbance currently.   Past Medical History:  Diagnosis Date   Allergy 1975   Mold, pollen - shots then but now treated by over the counter meds   Cataract 2018   Removed   Coronary artery disease    Diabetes mellitus without complication (HCC) 1995   Type 2 - medication   GERD (gastroesophageal reflux disease) 1985   Treated by medication   Hypertension 2015   Treated by medication   Localized osteoporosis without current pathological fracture 01/18/2022   Mixed hyperlipidemia 02/06/2008   Myocardial infarction (HCC) 11/03/2019    Past Surgical History:  Procedure Laterality Date   CARDIAC CATHETERIZATION  11/04/2019   PCI  to LCX   EYE SURGERY  2018   Cataracts removed    Family History  Problem Relation Age of Onset   Hypertension Mother    Dementia Mother    Hyperlipidemia Mother    Cancer Father    Heart disease Father    Stroke Brother    Heart disease Brother     Social History    Socioeconomic History   Marital status: Married    Spouse name: Not on file   Number of children: 2   Years of education: Not on file   Highest education level: Not on file  Occupational History   Occupation: Retired Presenter, broadcasting  Tobacco Use   Smoking status: Never   Smokeless tobacco: Never   Tobacco comments:    Never  Vaping Use   Vaping Use: Never used  Substance and Sexual Activity   Alcohol use: Not Currently    Comment: Many years ago   Drug use: Not Currently    Types: Marijuana    Comment: Many years ago - 1973   Sexual activity: Yes    Birth control/protection: Other-see comments, None    Comment: Active some but have ED.  Other Topics Concern   Not on file  Social History Narrative   Not on file   Social Determinants of Health   Financial Resource Strain: Low Risk  (10/18/2022)   Overall Financial Resource Strain (CARDIA)    Difficulty of Paying Living Expenses: Not hard at all  Food Insecurity: No Food Insecurity (10/18/2022)   Hunger Vital Sign    Worried About Running Out of Food in the Last Year: Never true    Ran Out of Food in the Last Year: Never true  Transportation Needs: No Transportation Needs (10/18/2022)   PRAPARE - Hydrologist (Medical): No    Lack of Transportation (Non-Medical): No  Physical Activity: Sufficiently Active (10/18/2022)   Exercise Vital Sign    Days of Exercise per Week: 5 days    Minutes of Exercise per Session: 120 min  Stress: No Stress Concern Present (10/18/2022)   [place]    Feeling of Stress : Not at all  Social Connections: [place] (10/18/2022)   Social Connection and Isolation Panel [NHANES]    Frequency of Communication with Friends and Family: Twice a week    Frequency of Social Gatherings with Friends and Family: Once a week    Attends Religious Services: More than 4 times per year    Active Member of  [employer] or Organizations: No    Attends Music therapist: More than 4 times per year    Marital Status: Married  Human resources officer Violence: Not At Risk (10/18/2022)   Humiliation, Afraid, Rape, and Kick questionnaire    Fear of Current or Ex-Partner: No    Emotionally Abused: No    Physically Abused: No    Sexually Abused: No    Outpatient Medications Prior to Visit  Medication Sig Dispense Refill   aspirin EC 81 MG tablet Take 81 mg by mouth daily. Swallow whole.     atorvastatin (LIPITOR) 80 MG tablet Take 1 tablet (80 mg total) by mouth at bedtime. 90 tablet 3   carvedilol (COREG) 3.125 MG tablet TAKE 1 TABLET BY MOUTH TWICE A DAY 180 tablet 1   cetirizine (ZYRTEC) 10 MG tablet Take by mouth.     Cholecalciferol (VITAMIN D) 50 MCG (2000 UT) tablet Take 2,000 Units by mouth daily.     Cyanocobalamin (VITAMIN B-12 PO) Take 1 tablet by mouth daily.     ELDERBERRY PO Take 1 tablet by mouth daily.     FARXIGA 10 MG TABS tablet TAKE 1 TABLET BY MOUTH EVERY DAY 30 tablet 5   FIBER PO Take 2 tablets by mouth daily.     fluticasone (FLONASE) 50 MCG/ACT nasal spray SPRAY 2 SPRAYS INTO EACH NOSTRIL EVERY DAY 48 mL 2   GARLIC PO Take by mouth.     Ginkgo Biloba (GNP GINGKO BILOBA EXTRACT PO) Take by mouth.     lisinopril (ZESTRIL) 20 MG tablet Take 1 tablet (20 mg total) by mouth daily. 90 tablet 3   nitroGLYCERIN (NITROSTAT) 0.4 MG SL tablet Place 1 tablet (0.4 mg total) under the tongue every 5 (five) minutes as needed for chest pain. 10 tablet 2   NON FORMULARY Thyroid complex 2 tablets every other day     NON FORMULARY Coenzymated B-3 1 tablet daily     NON FORMULARY Citrust Bermamot extract 1 tablet daily     NON FORMULARY Liver essentials 1 tablet daily     sildenafil (VIAGRA) 50 MG tablet Take 1 tablet (50 mg total) by mouth daily as needed for erectile dysfunction. 30 tablet 2   Testosterone 20.25 MG/ACT (1.62%) GEL Apply 20.25mg /ACT on alternate shoulder every morning ( only  one ACT per day) 75 g 0   Thiamine HCl (VITAMIN B-1 PO) Take 1 tablet by mouth daily.     TURMERIC CURCUMIN PO Take 1 tablet by mouth daily.     No facility-administered medications prior to visit.    Allergies  Allergen Reactions   Amoxicillin-Pot Clavulanate  severe diahrea-c.diff.    ROS Review of Systems  Constitutional:  Negative for chills and fever.  HENT:  Negative for congestion and sore throat.   Eyes:  Negative for pain and discharge.  Respiratory:  Negative for cough and shortness of breath.   Cardiovascular:  Negative for chest pain and palpitations.  Gastrointestinal:  Negative for constipation, diarrhea, nausea and vomiting.  Endocrine: Negative for polydipsia and polyuria.  Genitourinary:  Negative for dysuria and hematuria.  Musculoskeletal:  Negative for neck pain and neck stiffness.  Skin:  Negative for rash.  Neurological:  Negative for dizziness, weakness, numbness and headaches.  Psychiatric/Behavioral:  Negative for agitation and behavioral problems.       Objective:    Physical Exam Vitals reviewed.  Constitutional:      General: He is not in acute distress.    Appearance: He is not diaphoretic.  HENT:     Head: Normocephalic and atraumatic.     Nose: Nose normal.     Mouth/Throat:     Mouth: Mucous membranes are moist.  Eyes:     General: No scleral icterus.    Extraocular Movements: Extraocular movements intact.  Cardiovascular:     Rate and Rhythm: Normal rate and regular rhythm.     Pulses: Normal pulses.     Heart sounds: Normal heart sounds. No murmur heard. Pulmonary:     Breath sounds: Normal breath sounds. No wheezing or rales.  Musculoskeletal:     Cervical back: Neck supple. No tenderness.     Right lower leg: No edema.     Left lower leg: No edema.  Skin:    General: Skin is warm.     Findings: No rash.  Neurological:     General: No focal deficit present.     Mental Status: He is alert and oriented to person, place,  and time.     Sensory: No sensory deficit.     Motor: No weakness.  Psychiatric:        Mood and Affect: Mood normal.        Behavior: Behavior normal.     BP 107/66 (BP Location: Left Arm, Patient Position: Sitting, Cuff Size: Normal)   Pulse (!) 57   Ht 5\' 10"  (1.778 m)   Wt 176 lb 3.2 oz (79.9 kg)   SpO2 97%   BMI 25.28 kg/m  Wt Readings from Last 3 Encounters:  10/18/22 176 lb (79.8 kg)  10/18/22 176 lb 3.2 oz (79.9 kg)  10/02/22 179 lb 3.2 oz (81.3 kg)    Lab Results  Component Value Date   TSH 3.060 04/10/2022   Lab Results  Component Value Date   WBC 4.5 09/27/2022   HGB 15.6 09/27/2022   HCT 47.2 09/27/2022   MCV 95 09/27/2022   PLT 169 09/27/2022   Lab Results  Component Value Date   NA 138 04/10/2022   K 5.2 04/10/2022   CO2 24 04/10/2022   GLUCOSE 147 (H) 04/10/2022   BUN 30 (H) 04/10/2022   CREATININE 1.09 04/10/2022   BILITOT 0.6 04/10/2022   ALKPHOS 90 04/10/2022   AST 22 04/10/2022   ALT 28 04/10/2022   PROT 6.9 04/10/2022   ALBUMIN 4.6 04/10/2022   CALCIUM 11.3 (H) 09/27/2022   EGFR 73 04/10/2022   Lab Results  Component Value Date   CHOL 96 (L) 04/10/2022   Lab Results  Component Value Date   HDL 31 (L) 04/10/2022   Lab Results  Component Value Date  LDLCALC 49 04/10/2022   Lab Results  Component Value Date   TRIG 74 04/10/2022   Lab Results  Component Value Date   CHOLHDL 3.1 04/10/2022   Lab Results  Component Value Date   HGBA1C 8.4 (A) 10/02/2022      Assessment & Plan:   Problem List Items Addressed This Visit       Cardiovascular and Mediastinum   CAD (coronary artery disease) - Primary    S/p stent placement On aspirin and statin currently Followed by cardiology      Essential hypertension, benign    BP Readings from Last 1 Encounters:  10/18/22 107/66  Well-controlled with lisinopril and Coreg Counseled for compliance with the medications Advised DASH diet and moderate exercise/walking, at least  150 mins/week      Relevant Orders   TSH   CBC with Differential/Platelet     Endocrine   Hyperparathyroidism, primary (HCC)    No signs of complications currently Last CMP showed elevated calcium, but stable Followed by Dr Fransico Him Planned to see Dr. Gerrit Friends for parathyroidectomy      Relevant Orders   CBC with Differential/Platelet   Type 2 diabetes mellitus with other specified complication [facility])    Lab Results  Component Value Date   HGBA1C 8.4 (A) 10/02/2022  Uncontrolled now, has tried to follow low carb diet since last visit Associated with CAD, HTN and HLD On Farxiga 10 mg daily Added Metformin 500 mg QD Advised to follow diabetic diet On statin and ACEi F/u CMP and lipid panel Diabetic eye exam: Advised to follow up with Ophthalmology for diabetic eye exam      Relevant Medications   metFORMIN (GLUCOPHAGE-XR) 500 MG 24 hr tablet   Other Relevant Orders   Hemoglobin A1c   CMP14+EGFR     Other   Mixed hyperlipidemia    On Lipitor Reviewed lipid profile      Relevant Orders   Lipid panel   Erectile dysfunction    Could be due to medications Viagra 50 mg daily as needed Also Dr Fransico Him recently started testosterone gel for osteoporosis, but awaiting insurance approval      Other Visit Diagnoses     Vitamin D deficiency       Relevant Orders   VITAMIN D 25 Hydroxy (Vit-D Deficiency, Fractures)       Meds ordered this encounter  Medications   metFORMIN (GLUCOPHAGE-XR) 500 MG 24 hr tablet    Sig: Take 1 tablet (500 mg total) by mouth daily with breakfast.    Dispense:  90 tablet    Refill:  1    Follow-up: Return in about 6 months (around 04/18/2023) for Annual physical.    Anabel Halon, MD

## 2022-10-18 NOTE — Assessment & Plan Note (Signed)
S/p stent placement On aspirin and statin currently Followed by cardiology

## 2022-10-18 NOTE — Assessment & Plan Note (Addendum)
Lab Results  Component Value Date   HGBA1C 8.4 (A) 10/02/2022   Uncontrolled now, has tried to follow low carb diet since last visit Associated with CAD, HTN and HLD On Farxiga 10 mg daily Added Metformin 500 mg QD Advised to follow diabetic diet On statin and ACEi F/u CMP and lipid panel Diabetic eye exam: Advised to follow up with Ophthalmology for diabetic eye exam

## 2022-10-18 NOTE — Assessment & Plan Note (Signed)
BP Readings from Last 1 Encounters:  10/18/22 107/66   Well-controlled with lisinopril and Coreg Counseled for compliance with the medications Advised DASH diet and moderate exercise/walking, at least 150 mins/week

## 2022-10-18 NOTE — Progress Notes (Signed)
Subjective:   Jesse Jordan is a 72 y.o. male who presents for Medicare Annual/Subsequent preventive examination.  Review of Systems     Objective:    Today's Vitals   10/18/22 0926  BP: 107/66  Pulse: (!) 57  SpO2: 97%  Weight: 176 lb (79.8 kg)  Height: 5\' 10"  (1.778 m)   Body mass index is 25.25 kg/m.     08/24/2021    8:07 AM  Advanced Directives  Does Patient Have a Medical Advance Directive? No    Current Medications (verified) Outpatient Encounter Medications as of 10/18/2022  Medication Sig   aspirin EC 81 MG tablet Take 81 mg by mouth daily. Swallow whole.   atorvastatin (LIPITOR) 80 MG tablet Take 1 tablet (80 mg total) by mouth at bedtime.   carvedilol (COREG) 3.125 MG tablet TAKE 1 TABLET BY MOUTH TWICE A DAY   cetirizine (ZYRTEC) 10 MG tablet Take by mouth.   Cholecalciferol (VITAMIN D) 50 MCG (2000 UT) tablet Take 2,000 Units by mouth daily.   Cyanocobalamin (VITAMIN B-12 PO) Take 1 tablet by mouth daily.   ELDERBERRY PO Take 1 tablet by mouth daily.   FARXIGA 10 MG TABS tablet TAKE 1 TABLET BY MOUTH EVERY DAY   FIBER PO Take 2 tablets by mouth daily.   fluticasone (FLONASE) 50 MCG/ACT nasal spray SPRAY 2 SPRAYS INTO EACH NOSTRIL EVERY DAY   GARLIC PO Take by mouth.   Ginkgo Biloba (GNP GINGKO BILOBA EXTRACT PO) Take by mouth.   lisinopril (ZESTRIL) 20 MG tablet Take 1 tablet (20 mg total) by mouth daily.   metFORMIN (GLUCOPHAGE-XR) 500 MG 24 hr tablet Take 1 tablet (500 mg total) by mouth daily with breakfast.   nitroGLYCERIN (NITROSTAT) 0.4 MG SL tablet Place 1 tablet (0.4 mg total) under the tongue every 5 (five) minutes as needed for chest pain.   NON FORMULARY Thyroid complex 2 tablets every other day   NON FORMULARY Coenzymated B-3 1 tablet daily   NON FORMULARY Citrust Bermamot extract 1 tablet daily   NON FORMULARY Liver essentials 1 tablet daily   sildenafil (VIAGRA) 50 MG tablet Take 1 tablet (50 mg total) by mouth daily as needed for erectile  dysfunction.   Testosterone 20.25 MG/ACT (1.62%) GEL Apply 20.25mg /ACT on alternate shoulder every morning ( only one ACT per day)   Thiamine HCl (VITAMIN B-1 PO) Take 1 tablet by mouth daily.   TURMERIC CURCUMIN PO Take 1 tablet by mouth daily.   No facility-administered encounter medications on file as of 10/18/2022.    Allergies (verified) Amoxicillin-pot clavulanate   History: Past Medical History:  Diagnosis Date   Allergy 1975   Mold, pollen - shots then but now treated by over the counter meds   Cataract 2018   Removed   Coronary artery disease    Diabetes mellitus without complication (HCC) 1995   Type 2 - medication   GERD (gastroesophageal reflux disease) 1985   Treated by medication   Hypertension 2015   Treated by medication   Localized osteoporosis without current pathological fracture 01/18/2022   Mixed hyperlipidemia 02/06/2008   Myocardial infarction (HCC) 11/03/2019   Past Surgical History:  Procedure Laterality Date   CARDIAC CATHETERIZATION  11/04/2019   PCI  to LCX   EYE SURGERY  2018   Cataracts removed   Family History  Problem Relation Age of Onset   Hypertension Mother    Dementia Mother    Hyperlipidemia Mother    Cancer Father  Heart disease Father    Social History   Socioeconomic History   Marital status: Married    Spouse name: Not on file   Number of children: 2   Years of education: Not on file   Highest education level: Not on file  Occupational History   Occupation: Retired Programmer, applications  Tobacco Use   Smoking status: Never   Smokeless tobacco: Never   Tobacco comments:    Never  Vaping Use   Vaping Use: Never used  Substance and Sexual Activity   Alcohol use: Not Currently    Comment: Many years ago   Drug use: Not Currently    Types: Marijuana    Comment: Many years ago - 1973   Sexual activity: Yes    Birth control/protection: Other-see comments, None    Comment: Active some but have ED.  Other Topics Concern    Not on file  Social History Narrative   Not on file   Social Determinants of Health   Financial Resource Strain: Not on file  Food Insecurity: Not on file  Transportation Needs: No Transportation Needs (08/24/2021)   PRAPARE - Hydrologist (Medical): No    Lack of Transportation (Non-Medical): No  Physical Activity: Sufficiently Active (08/24/2021)   Exercise Vital Sign    Days of Exercise per Week: 6 days    Minutes of Exercise per Session: 60 min  Stress: Not on file  Social Connections: Socially Integrated (08/24/2021)   Social Connection and Isolation Panel [NHANES]    Frequency of Communication with Friends and Family: More than three times a week    Frequency of Social Gatherings with Friends and Family: More than three times a week    Attends Religious Services: More than 4 times per year    Active Member of [employer] or Organizations: Yes    Attends Music therapist: More than 4 times per year    Marital Status: Married    Tobacco Counseling Counseling given: Not Answered Tobacco comments: Never   Clinical Intake:     Pain : No/denies pain     BMI - recorded: 25.25 Nutritional Status: BMI 25 -29 Overweight Diabetes: Yes CBG done?: No Did pt. bring in CBG monitor from home?: No  How often do you need to have someone help you when you read instructions, pamphlets, or other written materials from your doctor or pharmacy?: 1 - Never  Diabetic? yes  Interpreter Needed?: No      Activities of Daily Living    10/18/2022    8:08 AM  In your present state of health, do you have any difficulty performing the following activities:  Hearing? 0  Vision? 0  Difficulty concentrating or making decisions? 0  Walking or climbing stairs? 0  Dressing or bathing? 0  Doing errands, shopping? 0  Preparing Food and eating ? N  Using the Toilet? N  In the past six months, have you accidently leaked urine? N  Do you have problems  with loss of bowel control? N  Managing your Medications? N  Managing your Finances? N  Housekeeping or managing your Housekeeping? N    Patient Care Team: [name], MD as PCP - General (Internal Medicine)  Indicate any recent Medical Services you may have received from other than [facility] providers in the past year (date may be approximate).     Assessment:   This is a routine wellness examination for Aerion.  Hearing/Vision screen No results  found.  Dietary issues and exercise activities discussed:     Goals Addressed   None   Depression Screen    10/18/2022    8:53 AM 04/16/2022    8:51 AM 02/15/2022   10:05 AM 01/10/2022   10:15 AM 12/14/2021    8:06 AM 09/25/2021    1:52 PM 08/29/2021    8:36 AM  PHQ 2/9 Scores  PHQ - 2 Score 0 0 0 0 0 0 0    Fall Risk    10/18/2022    8:53 AM 10/18/2022    8:08 AM 04/16/2022    8:50 AM 02/15/2022   10:05 AM 01/10/2022   10:15 AM  Fall Risk   Falls in the past year? 0 0 0 0 0  Number falls in past yr: 0 0 0 0 0  Injury with Fall? 0 0 0 0 0  Risk for fall due to :   No Fall Risks No Fall Risks No Fall Risks  Follow up   Falls evaluation completed Falls evaluation completed Falls evaluation completed    FALL RISK PREVENTION PERTAINING TO THE HOME:  Any stairs in or around the home? Yes  If so, are there any without handrails? Yes  Home free of loose throw rugs in walkways, pet beds, electrical cords, etc? Yes  Adequate lighting in your home to reduce risk of falls? Yes   ASSISTIVE DEVICES UTILIZED TO PREVENT FALLS:  Life alert? No  Use of a cane, walker or w/c? No  Grab bars in the bathroom? No  Shower chair or bench in shower? No  Elevated toilet seat or a handicapped toilet? No   TIMED UP AND GO:  Was the test performed? Yes .  Length of time to ambulate 10 feet: 5 sec.   Gait steady and fast without use of assistive device  Cognitive Function:        08/24/2021    8:21 AM  6CIT Screen  What Year? 0 points   What month? 0 points  What time? 0 points  Count back from 20 0 points  Months in reverse 0 points  Repeat phrase 0 points  Total Score 0 points    Immunizations Immunization History  Administered Date(s) Administered   PPD Test 11/06/2021    TDAP status: Due, Education has been provided regarding the importance of this vaccine. Advised may receive this vaccine at local pharmacy or Health Dept. Aware to provide a copy of the vaccination record if obtained from local pharmacy or Health Dept. Verbalized acceptance and understanding.  Flu Vaccine status: Declined, Education has been provided regarding the importance of this vaccine but patient still declined. Advised may receive this vaccine at local pharmacy or Health Dept. Aware to provide a copy of the vaccination record if obtained from local pharmacy or Health Dept. Verbalized acceptance and understanding.  Pneumococcal vaccine status: Declined,  Education has been provided regarding the importance of this vaccine but patient still declined. Advised may receive this vaccine at local pharmacy or Health Dept. Aware to provide a copy of the vaccination record if obtained from local pharmacy or Health Dept. Verbalized acceptance and understanding.   Covid-19 vaccine status: Declined, Education has been provided regarding the importance of this vaccine but patient still declined. Advised may receive this vaccine at local pharmacy or Health Dept.or vaccine clinic. Aware to provide a copy of the vaccination record if obtained from local pharmacy or Health Dept. Verbalized acceptance and understanding.  Qualifies for Shingles Vaccine?  No   Zostavax completed No   Shingrix Completed?: No.    Education has been provided regarding the importance of this vaccine. Patient has been advised to call insurance company to determine out of pocket expense if they have not yet received this vaccine. Advised may also receive vaccine at local pharmacy or Health  Dept. Verbalized acceptance and understanding.  Screening Tests Health Maintenance  Topic Date Due   COVID-19 Vaccine (1) Never done   Diabetic kidney evaluation - Urine ACR  Never done   DTaP/Tdap/Td (1 - Tdap) Never done   INFLUENZA VACCINE  12/23/2022 (Originally 04/24/2022)   OPHTHALMOLOGY EXAM  01/18/2023   HEMOGLOBIN A1C  04/02/2023   Diabetic kidney evaluation - eGFR measurement  04/11/2023   FOOT EXAM  10/19/2023   Medicare Annual Wellness (AWV)  10/19/2023   COLONOSCOPY (Pts 45-63yrs Insurance coverage will need to be confirmed)  09/03/2028   Hepatitis C Screening  Completed   HPV VACCINES  Aged Out   Pneumonia Vaccine 40+ Years old  Discontinued   Zoster Vaccines- Shingrix  Discontinued    Health Maintenance  Health Maintenance Due  Topic Date Due   COVID-19 Vaccine (1) Never done   Diabetic kidney evaluation - Urine ACR  Never done   DTaP/Tdap/Td (1 - Tdap) Never done    Colorectal cancer screening: Type of screening: Colonoscopy. Completed 2019. Repeat every 10 years  Lung Cancer Screening: (Low Dose CT Chest recommended if Age 87-80 years, 30 pack-year currently smoking OR have quit w/in 15years.) does not qualify.   Lung Cancer Screening Referral: NA  Additional Screening:  Hepatitis C Screening: does not qualify; Completed 2022  Vision Screening: Recommended annual ophthalmology exams for early detection of glaucoma and other disorders of the eye. Is the patient up to date with their annual eye exam?  No  Who is the provider or what is the name of the office in which the patient attends annual eye exams? [facility] If pt is not established with a provider, would they like to be referred to a provider to establish care? No .   Dental Screening: Recommended annual dental exams for proper oral hygiene  Community Resource Referral / Chronic Care Management: CRR required this visit?  No   CCM required this visit?  No      Plan:     I have personally  reviewed and noted the following in the patient's chart:   Medical and social history Use of alcohol, tobacco or illicit drugs  Current medications and supplements including opioid prescriptions. Patient is not currently taking opioid prescriptions. Functional ability and status Nutritional status Physical activity Advanced directives List of other physicians Hospitalizations, surgeries, and ER visits in previous 12 months Vitals Screenings to include cognitive, depression, and falls Referrals and appointments  In addition, I have reviewed and discussed with patient certain preventive protocols, quality metrics, and best practice recommendations. A written personalized care plan for preventive services as well as general preventive health recommendations were provided to patient.     Smitty Knudsen, CMA   10/18/2022

## 2022-10-18 NOTE — Assessment & Plan Note (Signed)
On Lipitor ?Reviewed lipid profile ?

## 2022-10-18 NOTE — Patient Instructions (Addendum)
Please start taking Metformin as prescribed.  Please continue taking other medications as prescribed.  Please continue to follow low carb diet and ambulate as tolerated.  Please consider getting Tdap vaccine at local pharmacy.

## 2022-10-18 NOTE — Assessment & Plan Note (Addendum)
No signs of complications currently Last CMP showed elevated calcium, but stable Followed by Dr Dorris Fetch Planned to see Dr. Harlow Asa for parathyroidectomy

## 2022-10-18 NOTE — Patient Instructions (Signed)
  Jesse Jordan , Thank you for taking time to come for your Medicare Wellness Visit. I appreciate your ongoing commitment to your health goals. Please review the following plan we discussed and let me know if I can assist you in the future.   These are the goals we discussed:  Goals      HEMOGLOBIN A1C < 7     Patient Stated     Get diabetic eye exam- recommended yearly        This is a list of the screening recommended for you and due dates:  Health Maintenance  Topic Date Due   Yearly kidney health urinalysis for diabetes  Never done   DTaP/Tdap/Td vaccine (1 - Tdap) Never done   COVID-19 Vaccine (1) 11/03/2022*   Flu Shot  12/23/2022*   Eye exam for diabetics  01/18/2023   Hemoglobin A1C  04/02/2023   Yearly kidney function blood test for diabetes  04/11/2023   Complete foot exam   10/19/2023   Medicare Annual Wellness Visit  10/19/2023   Colon Cancer Screening  09/03/2028   Hepatitis C Screening: USPSTF Recommendation to screen - Ages 18-79 yo.  Completed   HPV Vaccine  Aged Out   Pneumonia Vaccine  Discontinued   Zoster (Shingles) Vaccine  Discontinued  *Topic was postponed. The date shown is not the original due date.

## 2022-10-18 NOTE — Assessment & Plan Note (Addendum)
Could be due to medications Viagra 50 mg daily as needed Also Dr Dorris Fetch recently started testosterone gel for osteoporosis, but awaiting insurance approval

## 2022-11-01 DIAGNOSIS — E210 Primary hyperparathyroidism: Secondary | ICD-10-CM | POA: Diagnosis not present

## 2022-11-07 ENCOUNTER — Other Ambulatory Visit: Payer: Self-pay | Admitting: Surgery

## 2022-11-07 DIAGNOSIS — E210 Primary hyperparathyroidism: Secondary | ICD-10-CM

## 2022-11-11 ENCOUNTER — Other Ambulatory Visit: Payer: Self-pay | Admitting: Internal Medicine

## 2022-11-12 ENCOUNTER — Other Ambulatory Visit: Payer: Self-pay | Admitting: Internal Medicine

## 2022-11-12 DIAGNOSIS — J018 Other acute sinusitis: Secondary | ICD-10-CM

## 2022-11-13 ENCOUNTER — Encounter (HOSPITAL_COMMUNITY): Payer: Self-pay | Admitting: Surgery

## 2022-11-13 ENCOUNTER — Other Ambulatory Visit (HOSPITAL_COMMUNITY): Payer: Self-pay | Admitting: Surgery

## 2022-11-13 DIAGNOSIS — E210 Primary hyperparathyroidism: Secondary | ICD-10-CM

## 2022-11-16 ENCOUNTER — Other Ambulatory Visit (HOSPITAL_COMMUNITY): Payer: Self-pay | Admitting: Surgery

## 2022-11-16 DIAGNOSIS — E210 Primary hyperparathyroidism: Secondary | ICD-10-CM

## 2022-11-21 ENCOUNTER — Ambulatory Visit (HOSPITAL_COMMUNITY)
Admission: RE | Admit: 2022-11-21 | Discharge: 2022-11-21 | Disposition: A | Discharge status: Home / Self Care | Payer: Medicare HMO | Source: Ambulatory Visit | Attending: Surgery | Admitting: Surgery

## 2022-11-21 DIAGNOSIS — E041 Nontoxic single thyroid nodule: Secondary | ICD-10-CM | POA: Diagnosis not present

## 2022-11-21 DIAGNOSIS — D351 Benign neoplasm of parathyroid gland: Secondary | ICD-10-CM | POA: Diagnosis not present

## 2022-11-21 DIAGNOSIS — E210 Primary hyperparathyroidism: Secondary | ICD-10-CM | POA: Diagnosis not present

## 2022-11-21 MED ORDER — TECHNETIUM TC 99M SESTAMIBI GENERIC - CARDIOLITE
25.0000 | Freq: Once | INTRAVENOUS | Status: AC | PRN
  Administered 2022-11-21: 25.6 via INTRAVENOUS

## 2022-11-23 ENCOUNTER — Ambulatory Visit: Payer: Self-pay | Admitting: Surgery

## 2022-11-23 NOTE — Progress Notes (Signed)
USN and sestamibi are positive for parathyroid adenoma.  The sestamibi indicates an adenoma on the right.  However, the USN indicates possibly bilateral inferior adenomas.  Will plan to explore both lower poles at the time of surgery.  Should still be an outpatient procedure with removal of one or two parathyroid glands.  Will enter orders and send to scheduling to contact patient.  [name], MD [facility] [place] [facility] Office: [phone]

## 2022-12-03 ENCOUNTER — Other Ambulatory Visit: Payer: Medicare HMO

## 2022-12-05 NOTE — Patient Instructions (Signed)
DUE TO COVID-19 ONLY TWO VISITORS  (aged 70 and older)  ARE ALLOWED TO COME WITH YOU AND STAY IN THE WAITING ROOM ONLY DURING PRE OP AND PROCEDURE.   **NO VISITORS ARE ALLOWED IN THE SHORT STAY AREA OR RECOVERY ROOM!!**  IF YOU WILL BE ADMITTED INTO THE HOSPITAL YOU ARE ALLOWED ONLY FOUR SUPPORT PEOPLE DURING VISITATION HOURS ONLY (7 AM -8PM)   The support person(s) must pass our screening, gel in and out, and wear a mask at all times, including in the patient's room. Patients must also wear a mask when staff or their support person are in the room. Visitors GUEST BADGE MUST BE WORN VISIBLY  One adult visitor may remain with you overnight and MUST be in the room by 8 P.M.     Your procedure is scheduled on: 12/10/22   Report to [facility] Main Entrance    Report to admitting at : 6:45 AM   Call this number if you have problems the morning of surgery [phone]   Do not eat food :After Midnight.   After Midnight you may have the following liquids until : 6:00 AM DAY OF SURGERY  Water Black Coffee (sugar ok, NO MILK/CREAM OR CREAMERS)  Tea (sugar ok, NO MILK/CREAM OR CREAMERS) regular and decaf                             Plain Jell-O (NO RED)                                           Fruit ices (not with fruit pulp, NO RED)                                     Popsicles (NO RED)                                                                  Juice: apple, WHITE grape, WHITE cranberry Sports drinks like Gatorade (NO RED)    Oral Hygiene is also important to reduce your risk of infection.                                    Remember - BRUSH YOUR TEETH THE MORNING OF SURGERY WITH YOUR REGULAR TOOTHPASTE  DENTURES WILL BE REMOVED PRIOR TO SURGERY PLEASE DO NOT APPLY "Poly grip" OR ADHESIVES!!!   Do NOT smoke after Midnight   Take these medicines the morning of surgery with A SIP OF WATER: cetirizine,carvedilol.  How to Manage Your Diabetes Before and After Surgery  Why  is it important to control my blood sugar before and after surgery? Improving blood sugar levels before and after surgery helps healing and can limit problems. A way of improving blood sugar control is eating a healthy diet by:  Eating less sugar and carbohydrates  Increasing activity/exercise  Talking with your doctor about reaching your blood sugar goals High blood sugars (greater than 180 mg/dL) can  raise your risk of infections and slow your recovery, so you will need to focus on controlling your diabetes during the weeks before surgery. Make sure that the doctor who takes care of your diabetes knows about your planned surgery including the date and location.  How do I manage my blood sugar before surgery? Check your blood sugar at least 4 times a day, starting 2 days before surgery, to make sure that the level is not too high or low. Check your blood sugar the morning of your surgery when you wake up and every 2 hours until you get to the Short Stay unit. If your blood sugar is less than 70 mg/dL, you will need to treat for low blood sugar: Do not take insulin. Treat a low blood sugar (less than 70 mg/dL) with  cup of clear juice (cranberry or apple), 4 glucose tablets, OR glucose gel. Recheck blood sugar in 15 minutes after treatment (to make sure it is greater than 70 mg/dL). If your blood sugar is not greater than 70 mg/dL on recheck, call [phone] for further instructions. Report your blood sugar to the short stay nurse when you get to Short Stay.  If you are admitted to the hospital after surgery: Your blood sugar will be checked by the staff and you will probably be given insulin after surgery (instead of oral diabetes medicines) to make sure you have good blood sugar levels. The goal for blood sugar control after surgery is 80-180 mg/dL.   WHAT DO I DO ABOUT MY DIABETES MEDICATION?  Do not take oral diabetes medicines (pills) the morning of surgery.  THE NIGHT BEFORE  SURGERY, take     units of       insulin.       THE MORNING OF SURGERY, take   units of         insulin.  DO NOT TAKE THE FOLLOWING 7 DAYS PRIOR TO SURGERY: Ozempic, Wegovy, Rybelsus (Semaglutide), Byetta (exenatide), Bydureon (exenatide ER), Victoza, Saxenda (liraglutide), or Trulicity (dulaglutide) Mounjaro (Tirzepatide) Adlyxin (Lixisenatide), Polyethylene Glycol Loxenatide.  If your CBG is greater than 220 mg/dL, you may take  of your sliding scale  (correction) dose of insulin.    For patients with insulin pumps: Contact your diabetes doctor for specific instructions before surgery. Decrease basal rates by 20% at midnight the night before your surgery. Note that if your surgery is planned to be longer than 2 hours, your insulin pump will be removed and intravenous (IV) insulin will be started and managed by the nurses and the anesthesiologist. You will be able to restart your insulin pump once you are awake and able to manage it.  Make sure to bring insulin pump supplies to the hospital with you in case the  site needs to be changed.  Patient Signature:  Date:   Nurse Signature:  Date:   Reviewed and Endorsed by [facility] Patient Education Committee, August 2015How to Manage Your Diabetes Before and After Surgery  Why is it important to control my blood sugar before and after surgery? Improving blood sugar levels before and after surgery helps healing and can limit problems. A way of improving blood sugar control is eating a healthy diet by:  Eating less sugar and carbohydrates  Increasing activity/exercise  Talking with your doctor about reaching your blood sugar goals High blood sugars (greater than 180 mg/dL) can raise your risk of infections and slow your recovery, so you will need to focus on controlling your diabetes during  the weeks before surgery. Make sure that the doctor who takes care of your diabetes knows about your planned surgery including the date and  location.  How do I manage my blood sugar before surgery? Check your blood sugar at least 4 times a day, starting 2 days before surgery, to make sure that the level is not too high or low. Check your blood sugar the morning of your surgery when you wake up and every 2 hours until you get to the Short Stay unit. If your blood sugar is less than 70 mg/dL, you will need to treat for low blood sugar: Do not take insulin. Treat a low blood sugar (less than 70 mg/dL) with  cup of clear juice (cranberry or apple), 4 glucose tablets, OR glucose gel. Recheck blood sugar in 15 minutes after treatment (to make sure it is greater than 70 mg/dL). If your blood sugar is not greater than 70 mg/dL on recheck, call [phone] for further instructions. Report your blood sugar to the short stay nurse when you get to Short Stay.  If you are admitted to the hospital after surgery: Your blood sugar will be checked by the staff and you will probably be given insulin after surgery (instead of oral diabetes medicines) to make sure you have good blood sugar levels. The goal for blood sugar control after surgery is 80-180 mg/dL.   WHAT DO I DO ABOUT MY DIABETES MEDICATION?  HOLD farxiga 72 hours before surgery. Last dose: 12/06/22 THE  BEFORE SURGERY, take metformin as usual.     THE MORNING OF SURGERY, DO NOT TAKE ANY ORAL DIABETIC MEDICATIONS DAY OF YOUR SURGERY  DO NOT TAKE THE FOLLOWING 7 DAYS PRIOR TO SURGERY: Ozempic, Wegovy, Rybelsus (Semaglutide), Byetta (exenatide), Bydureon (exenatide ER), Victoza, Saxenda (liraglutide), or Trulicity (dulaglutide) Mounjaro (Tirzepatide) Adlyxin (Lixisenatide), Polyethylene Glycol Loxenatide.  Bring CPAP mask and tubing day of surgery.                              You may not have any metal on your body including hair pins, jewelry, and body piercing             Do not wear lotions, powders, perfumes/cologne, or deodoran              Men may shave face and neck.   Do  not bring valuables to the hospital. [place].   Contacts, glasses, or bridgework may not be worn into surgery.   Bring small overnight bag day of surgery.   DO NOT [place]. PHARMACY WILL DISPENSE MEDICATIONS LISTED ON YOUR MEDICATION LIST TO YOU DURING YOUR ADMISSION [place]!    Patients discharged on the day of surgery will not be allowed to drive home.  Someone NEEDS to stay with you for the first 24 hours after anesthesia.   Special Instructions: Bring a copy of your healthcare power of attorney and living will documents         the day of surgery if you haven't scanned them before.              Please read over the following fact sheets you were given: IF YOU HAVE QUESTIONS ABOUT YOUR PRE-OP INSTRUCTIONS PLEASE CALL [place] - Preparing for Surgery Before surgery,  you can play an important role.  Because skin is not sterile, your skin needs to be as free of germs as possible.  You can reduce the number of germs on your skin by washing with CHG (chlorahexidine gluconate) soap before surgery.  CHG is an antiseptic cleaner which kills germs and bonds with the skin to continue killing germs even after washing. Please DO NOT use if you have an allergy to CHG or antibacterial soaps.  If your skin becomes reddened/irritated stop using the CHG and inform your nurse when you arrive at Short Stay. Do not shave (including legs and underarms) for at least 48 hours prior to the first CHG shower.  You may shave your face/neck. Please follow these instructions carefully:  1.  Shower with CHG Soap the night before surgery and the  morning of Surgery.  2.  If you choose to wash your hair, wash your hair first as usual with your  normal  shampoo.  3.  After you shampoo, rinse your hair and body thoroughly to remove the  shampoo.                           4.  Use CHG as you would any other liquid  soap.  You can apply chg directly  to the skin and wash                       Gently with a scrungie or clean washcloth.  5.  Apply the CHG Soap to your body ONLY FROM THE NECK DOWN.   Do not use on face/ open                           Wound or open sores. Avoid contact with eyes, ears mouth and genitals (private parts).                       Wash face,  Genitals (private parts) with your normal soap.             6.  Wash thoroughly, paying special attention to the area where your surgery  will be performed.  7.  Thoroughly rinse your body with warm water from the neck down.  8.  DO NOT shower/wash with your normal soap after using and rinsing off  the CHG Soap.                9.  Pat yourself dry with a clean towel.            10.  Wear clean pajamas.            11.  Place clean sheets on your bed the night of your first shower and do not  sleep with pets. Day of Surgery : Do not apply any lotions/deodorants the morning of surgery.  Please wear clean clothes to the hospital/surgery center.  FAILURE TO FOLLOW THESE INSTRUCTIONS MAY RESULT IN THE CANCELLATION OF YOUR SURGERY PATIENT SIGNATURE_________________________________  NURSE SIGNATURE__________________________________  ________________________________________________________________________

## 2022-12-06 ENCOUNTER — Encounter (HOSPITAL_COMMUNITY): Payer: Self-pay

## 2022-12-06 ENCOUNTER — Other Ambulatory Visit: Payer: Self-pay

## 2022-12-06 ENCOUNTER — Encounter (HOSPITAL_COMMUNITY)
Admission: RE | Admit: 2022-12-06 | Discharge: 2022-12-06 | Disposition: A | Discharge status: Home / Self Care | Payer: Medicare HMO | Source: Ambulatory Visit | Attending: Surgery | Admitting: Surgery

## 2022-12-06 VITALS — BP 128/60 | HR 72 | Temp 97.9°F | Ht 70.0 in | Wt 171.0 lb

## 2022-12-06 DIAGNOSIS — D649 Anemia, unspecified: Secondary | ICD-10-CM | POA: Diagnosis not present

## 2022-12-06 DIAGNOSIS — I2510 Atherosclerotic heart disease of native coronary artery without angina pectoris: Secondary | ICD-10-CM | POA: Diagnosis not present

## 2022-12-06 DIAGNOSIS — I10 Essential (primary) hypertension: Secondary | ICD-10-CM | POA: Insufficient documentation

## 2022-12-06 DIAGNOSIS — E1169 Type 2 diabetes mellitus with other specified complication: Secondary | ICD-10-CM | POA: Insufficient documentation

## 2022-12-06 DIAGNOSIS — E210 Primary hyperparathyroidism: Secondary | ICD-10-CM | POA: Diagnosis not present

## 2022-12-06 DIAGNOSIS — E1136 Type 2 diabetes mellitus with diabetic cataract: Secondary | ICD-10-CM | POA: Diagnosis not present

## 2022-12-06 DIAGNOSIS — Z01818 Encounter for other preprocedural examination: Secondary | ICD-10-CM | POA: Diagnosis not present

## 2022-12-06 HISTORY — DX: Vestibular neuronitis, bilateral: H81.23

## 2022-12-06 HISTORY — DX: Anemia, unspecified: D64.9

## 2022-12-06 LAB — CBC
HCT: 44 % (ref 39.0–52.0)
Hemoglobin: 14.8 g/dL (ref 13.0–17.0)
MCH: 32.4 pg (ref 26.0–34.0)
MCHC: 33.6 g/dL (ref 30.0–36.0)
MCV: 96.3 fL (ref 80.0–100.0)
Platelets: 168 10*3/uL (ref 150–400)
RBC: 4.57 MIL/uL (ref 4.22–5.81)
RDW: 12.7 % (ref 11.5–15.5)
WBC: 6.4 10*3/uL (ref 4.0–10.5)
nRBC: 0 % (ref 0.0–0.2)

## 2022-12-06 LAB — BASIC METABOLIC PANEL
Anion gap: 4 — ABNORMAL LOW (ref 5–15)
BUN: 36 mg/dL — ABNORMAL HIGH (ref 8–23)
CO2: 21 mmol/L — ABNORMAL LOW (ref 22–32)
Calcium: 10 mg/dL (ref 8.9–10.3)
Chloride: 109 mmol/L (ref 98–111)
Creatinine, Ser: 0.99 mg/dL (ref 0.61–1.24)
GFR, Estimated: >60 mL/min (ref >60)
Glucose, Bld: 195 mg/dL — ABNORMAL HIGH (ref 70–99)
Potassium: 3.8 mmol/L (ref 3.5–5.1)
Sodium: 134 mmol/L — ABNORMAL LOW (ref 135–145)

## 2022-12-06 LAB — GLUCOSE, CAPILLARY: Glucose-Capillary: 213 mg/dL — ABNORMAL HIGH (ref 70–99)

## 2022-12-06 NOTE — Progress Notes (Signed)
For Short Stay: [place] appointment date:  Bowel Prep reminder:   For Anesthesia: PCP - Dr. Ihor Dow. LOV: 10/18/22 Cardiologist - Dr. Marcello Moores O'Neal  Chest x-ray -  EKG -  Stress Test -  ECHO - 11/15/21 Cardiac Cath - 11/04/19 Pacemaker/ICD device last checked: Pacemaker orders received: Device Rep notified:  Spinal Cord Stimulator:  Sleep Study -  CPAP -   Fasting Blood Sugar - N/A Checks Blood Sugar __0___ times a day Date and result of last Hgb A1c- 8.4 : 10/02/22  Last dose of GLP1 agonist-  GLP1 instructions:   Last dose of SGLT-2 inhibitors- farxiga last dose: 12/06/22 SGLT-2 instructions:   Blood Thinner Instructions: Aspirin Instructions: No instructions yet. Last Dose:  Activity level: Can go up a flight of stairs and activities of daily living without stopping and without chest pain and/or shortness of breath   Able to exercise without chest pain and/or shortness of breath  Anesthesia review: Hx: HTN,MI,CAD  Patient denies shortness of breath, fever, cough and chest pain at PAT appointment   Patient verbalized understanding of instructions that were given to them at the PAT appointment. Patient was also instructed that they will need to review over the PAT instructions again at home before surgery.

## 2022-12-07 LAB — HEMOGLOBIN A1C
Hgb A1c MFr Bld: 7.9 % — ABNORMAL HIGH (ref 4.8–5.6)
Mean Plasma Glucose: 180 mg/dL

## 2022-12-07 NOTE — Progress Notes (Signed)
Anesthesia Chart Review:   Case: [id number] Date/Time: 12/10/22 0845   Procedure: NECK EXPLORATION WITH PARATHYROIDECTOMY - 2ND SCRUB PERSON PUJA   Anesthesia type: General   Pre-op diagnosis: PRIMARY HYPERPARATHYROIDISM   Location: [facility] ROOM 04 / [facility]   Surgeons: [name], MD       DISCUSSION: Pt is 72 years old with hx CAD (s/p PCI to CX 2021), HTN, DM, anemia   Last dose farxiga 12/06/22  Reviewed EKG with Dr. Daiva Huge - she felt as long at pt had good functionality, he could proceed. Spoke with pt by telephone. 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 Prior to starting this job, he was walking 2-3 miles on a treadmill several times a week. He feels he could walk up 2 flights of stairs without symptoms at this time. He denies chest pain or SOB; when he had his MI back in 2021, his sx were mild and he thought he had indigestion - he denies any similar symptoms since then.   VS: BP 128/60   Pulse 72   Temp 36.6 C (Oral)   Ht 5\' 10"  (1.778 m)   Wt 77.6 kg   SpO2 95%   BMI 24.54 kg/m   PROVIDERS: - PCP is [name], MD - Cardiologist is Eleonore Chiquito, MD. Last office visit 10/23/21. Next appt 12/13/22  LABS: Labs reviewed: Acceptable for surgery. (all labs ordered are listed, but only abnormal results are displayed)  Labs Reviewed  HEMOGLOBIN A1C - Abnormal; Notable for the following components:      Result Value   Hgb A1c MFr Bld 7.9 (*)    All other components within normal limits  BASIC METABOLIC PANEL - Abnormal; Notable for the following components:   Sodium 134 (*)    CO2 21 (*)    Glucose, Bld 195 (*)    BUN 36 (*)    Anion gap 4 (*)    All other components within normal limits  GLUCOSE, CAPILLARY - Abnormal; Notable for the following components:   Glucose-Capillary 213 (*)    All  other components within normal limits  CBC    EKG 12/06/22: NSR. RBBB. T wave abnormality, consider inferolateral ischemia    CV: Echo 11/15/21:  1. Left ventricular ejection fraction, by estimation, is 60 to 65%. The left ventricle has normal function. The left ventricle has no regional wall motion abnormalities. There is mild asymmetric left ventricular hypertrophy of the basal segment. Left ventricular diastolic parameters are indeterminate.   2. Right ventricular systolic function is normal. The right ventricular size is mildly enlarged. There is normal pulmonary artery systolic pressure. The estimated right ventricular systolic pressure is [id number] mmHg.   3. The mitral valve is grossly normal. Trivial mitral valve regurgitation.   4. The aortic valve is tricuspid. Aortic valve regurgitation is trivial. No aortic stenosis is present. Aortic valve mean gradient measures 2.0 mmHg.   5. The inferior vena cava is normal in size with greater than 50% respiratory variability, suggesting right atrial pressure of 3 mmHg.   Cardiac cath at [facility] in [place] 11/04/19 (found in media tab dated 10/12/21 on pg 16) - Mild disease of LM (20%) and proximal to mid RCA (20%).  - Moderate disease of mid LAD (40%) - Severe disease mid CX (99%), tiny ramus intermedius (70%), and  tiny D1 (80%). S/p DES to CX (--> 0% residual) - EF 50% with mild anterolateral wall hypokinesis.    Past Medical History:  Diagnosis Date   Allergy 1975   Mold, pollen - shots then but now treated by over the counter meds   Anemia    Cataract 2018   Removed   Coronary artery disease    Diabetes mellitus without complication ([place]) [id number]   Type 2 - medication   GERD (gastroesophageal reflux disease) 1985   Treated by medication   Hypertension 2015   Treated by medication   Localized osteoporosis without current pathological fracture 01/18/2022   Mixed hyperlipidemia 02/06/2008   Myocardial infarction ([place]) 11/03/2019    Vestibular neuritis, bilateral     Past Surgical History:  Procedure Laterality Date   CARDIAC CATHETERIZATION  11/04/2019   PCI  to LCX   EYE SURGERY  2018   Cataracts removed    MEDICATIONS:  dextromethorphan-guaiFENesin (ROBITUSSIN-DM) 10-100 MG/5ML liquid   aspirin EC 81 MG tablet   atorvastatin (LIPITOR) 80 MG tablet   carvedilol (COREG) 3.125 MG tablet   cetirizine (ZYRTEC) 10 MG tablet   Cholecalciferol (VITAMIN D) 50 MCG (2000 UT) tablet   Cyanocobalamin (VITAMIN B-12 PO)   FARXIGA 10 MG TABS tablet   FIBER PO   fluticasone (FLONASE) 50 MCG/ACT nasal spray   GARLIC PO   Ginkgo Biloba (GNP GINGKO BILOBA EXTRACT PO)   lisinopril (ZESTRIL) 20 MG tablet   metFORMIN (GLUCOPHAGE-XR) 500 MG 24 hr tablet   nitroGLYCERIN (NITROSTAT) 0.4 MG SL tablet   NON FORMULARY   NON FORMULARY   sildenafil (VIAGRA) 50 MG tablet   Thiamine HCl (VITAMIN B-1 PO)   No current facility-administered medications for this encounter.    If no changes, I anticipate pt can proceed with surgery as scheduled.   Willeen Cass, PhD, FNP-BC [facility] Stay [facility]/Anesthesiology Phone: [phone] 12/07/2022 2:32 PM

## 2022-12-07 NOTE — Anesthesia Preprocedure Evaluation (Addendum)
Anesthesia Evaluation  Patient identified by MRN, date of birth, ID band Patient awake    Reviewed: Allergy & Precautions, NPO status , Patient's Chart, lab work & pertinent test results, reviewed documented beta blocker date and time   Airway Mallampati: III  TM Distance: >3 FB Neck ROM: Full    Dental  (+) Teeth Intact, Dental Advisory Given   Pulmonary neg pulmonary ROS   Pulmonary exam normal breath sounds clear to auscultation       Cardiovascular hypertension, Pt. on home beta blockers and Pt. on medications + CAD, + Past MI and + Cardiac Stents  Normal cardiovascular exam Rhythm:Regular Rate:Normal     Neuro/Psych  Neuromuscular disease  negative psych ROS   GI/Hepatic Neg liver ROS,GERD  ,,  Endo/Other  diabetes, Type 2, Oral Hypoglycemic Agents    Renal/GU negative Renal ROS     Musculoskeletal negative musculoskeletal ROS (+)    Abdominal   Peds  Hematology negative hematology ROS (+)   Anesthesia Other Findings Day of surgery medications reviewed with the patient.  Reproductive/Obstetrics                             Anesthesia Physical Anesthesia Plan  ASA: 3  Anesthesia Plan: General   Post-op Pain Management: Tylenol PO (pre-op)*   Induction: Intravenous  PONV Risk Score and Plan: 3 and Dexamethasone, Ondansetron and Treatment may vary due to age or medical condition  Airway Management Planned: Oral ETT  Additional Equipment:   Intra-op Plan:   Post-operative Plan: Extubation in OR  Informed Consent: I have reviewed the patients History and Physical, chart, labs and discussed the procedure including the risks, benefits and alternatives for the proposed anesthesia with the patient or authorized representative who has indicated his/her understanding and acceptance.     Dental advisory given  Plan Discussed with: CRNA  Anesthesia Plan Comments: (See APP note by  Joslyn Hy, FNP )       Anesthesia Quick Evaluation

## 2022-12-09 ENCOUNTER — Encounter (HOSPITAL_COMMUNITY): Payer: Self-pay | Admitting: Surgery

## 2022-12-09 NOTE — Progress Notes (Deleted)
Cardiology Office Note:   Date:  12/09/2022  NAME:  Jesse Jordan    MRN: [id number] DOB:  1951-05-23   PCP:  [name], MD  Cardiologist:  None  Electrophysiologist:  None   Referring MD: [name], MD   No chief complaint on file. ***  History of Present Illness:   Jesse Jordan is a 72 y.o. male with a hx of CAD, DM, HLD who presents for follow-up.   Problem List CAD -NSTEMI 11/04/2019 [facility]) -multivessel CAD -PCI to [facility] HTN DM -A1c 7.9 HLD -T chol 96, HDL 31, LDL 49, TG 74 5. RBBB  Past Medical History: Past Medical History:  Diagnosis Date   Allergy 1975   Mold, pollen - shots then but now treated by over the counter meds   Anemia    Cataract 2018   Removed   Coronary artery disease    Diabetes mellitus without complication ([place]) Q000111Q   Type 2 - medication   GERD (gastroesophageal reflux disease) 1985   Treated by medication   Hypertension 2015   Treated by medication   Localized osteoporosis without current pathological fracture 01/18/2022   Mixed hyperlipidemia 02/06/2008   Myocardial infarction ([place]) 11/03/2019   Vestibular neuritis, bilateral     Past Surgical History: Past Surgical History:  Procedure Laterality Date   CARDIAC CATHETERIZATION  11/04/2019   PCI  to LCX   EYE SURGERY  2018   Cataracts removed    Current Medications: No outpatient medications have been marked as taking for the 12/13/22 encounter (Appointment) with Geralynn Rile, MD.     Allergies:    Amoxicillin-pot clavulanate   Social History: Social History   Socioeconomic History   Marital status: Married    Spouse name: Not on file   Number of children: 2   Years of education: Not on file   Highest education level: Not on file  Occupational History   Occupation: Retired Programmer, applications  Tobacco Use   Smoking status: Never   Smokeless tobacco: Never   Tobacco comments:    Never  Vaping Use   Vaping Use: Never used   Substance and Sexual Activity   Alcohol use: Not Currently    Comment: Many years ago   Drug use: Not Currently    Types: Marijuana    Comment: Many years ago - 1973   Sexual activity: Yes    Birth control/protection: Other-see comments, None    Comment: Active some but have ED.  Other Topics Concern   Not on file  Social History Narrative   Not on file   Social Determinants of Health   Financial Resource Strain: Low Risk  (10/18/2022)   Overall Financial Resource Strain (CARDIA)    Difficulty of Paying Living Expenses: Not hard at all  Food Insecurity: No Food Insecurity (10/18/2022)   Hunger Vital Sign    Worried About Running Out of Food in the Last Year: Never true    Ran Out of Food in the Last Year: Never true  Transportation Needs: No Transportation Needs (10/18/2022)   PRAPARE - Hydrologist (Medical): No    Lack of Transportation (Non-Medical): No  Physical Activity: Sufficiently Active (10/18/2022)   Exercise Vital Sign    Days of Exercise per Week: 5 days    Minutes of Exercise per Session: 120 min  Stress: No Stress Concern Present (10/18/2022)   [place]  Feeling of Stress : Not at all  Social Connections: Socially Integrated (10/18/2022)   Social Connection and Isolation Panel [NHANES]    Frequency of Communication with Friends and Family: Twice a week    Frequency of Social Gatherings with Friends and Family: Once a week    Attends Religious Services: More than 4 times per year    Active Member of [employer] or Organizations: No    Attends Music therapist: More than 4 times per year    Marital Status: Married     Family History: The patient's ***family history includes Cancer in his father; Dementia in his mother; Heart disease in his brother and father; Hyperlipidemia in his mother; Hypertension in his mother; Stroke in his brother.  ROS:   All other  ROS reviewed and negative. Pertinent positives noted in the HPI.     EKGs/Labs/Other Studies Reviewed:   The following studies were personally reviewed by me today:  EKG:  EKG is *** ordered today.  The ekg ordered today demonstrates ***, and was personally reviewed by me.   Recent Labs: 04/10/2022: ALT 28; TSH 3.060 05/14/2022: Magnesium 2.3 12/06/2022: BUN 36; Creatinine, Ser 0.99; Hemoglobin 14.8; Platelets 168; Potassium 3.8; Sodium 134   Recent Lipid Panel    Component Value Date/Time   CHOL 96 (L) 04/10/2022 0823   TRIG 74 04/10/2022 0823   HDL 31 (L) 04/10/2022 0823   CHOLHDL 3.1 04/10/2022 0823   LDLCALC 49 04/10/2022 0823    Physical Exam:   VS:  There were no vitals taken for this visit.   Wt Readings from Last 3 Encounters:  12/06/22 171 lb (77.6 kg)  10/18/22 176 lb (79.8 kg)  10/18/22 176 lb 3.2 oz (79.9 kg)    General: Well nourished, well developed, in no acute distress Head: Atraumatic, normal size  Eyes: PEERLA, EOMI  Neck: Supple, no JVD Endocrine: No thryomegaly Cardiac: Normal S1, S2; RRR; no murmurs, rubs, or gallops Lungs: Clear to auscultation bilaterally, no wheezing, rhonchi or rales  Abd: Soft, nontender, no hepatomegaly  Ext: No edema, pulses 2+ Musculoskeletal: No deformities, BUE and BLE strength normal and equal Skin: Warm and dry, no rashes   Neuro: Alert and oriented to person, place, time, and situation, CNII-XII grossly intact, no focal deficits  Psych: Normal mood and affect   ASSESSMENT:   Jesse Jordan is a 72 y.o. male who presents for the following: No diagnosis found.  PLAN:   There are no diagnoses linked to this encounter.  {Are you ordering a CV Procedure (e.g. stress test, cath, DCCV, TEE, etc)?   Press F2        :[id number]  Disposition: No follow-ups on file.  Medication Adjustments/Labs and Tests Ordered: Current medicines are reviewed at length with the patient today.  Concerns regarding medicines are outlined above.   No orders of the defined types were placed in this encounter.  No orders of the defined types were placed in this encounter.   There are no Patient Instructions on file for this visit.   Time Spent with Patient: I have spent a total of *** minutes with patient reviewing hospital notes, telemetry, EKGs, labs and examining the patient as well as establishing an assessment and plan that was discussed with the patient.  > 50% of time was spent in direct patient care.  Signed, Addison Naegeli. Audie Box, MD, [place]  [address], [place], [place] [address] [phone]  12/09/2022 9:04 PM

## 2022-12-09 NOTE — H&P (Signed)
REFERRING PHYSICIAN: Loni Beckwith, MD  PROVIDER: Rosamund Nyland Charlotta [place], MD   Chief Complaint: New Consultation (Hyperparathyroidism)  History of Present Illness:  Patient is referred by Dr. Loni Beckwith for surgical evaluation and management of suspected primary hyperparathyroidism. Patient had been noted to have hypercalcemia. This had persisted over the last year to a year and a half. Patient also underwent bone density scanning showing evidence of osteoporosis. Recent laboratory studies show a calcium level of 11.3 and an intact PTH level of 69. PTH level had been as high as 74 last summer. Patient has noted some fatigue. He denies nephrolithiasis. He denies bone and joint pain. Patient has not had any imaging studies performed. He has had no prior head or neck surgery. There is no family history of parathyroid disease or other endocrine neoplasm. Patient is retired. He continues to work in [employer] He is accompanied today by his wife.  Review of Systems: A complete review of systems was obtained from the patient. I have reviewed this information and discussed as appropriate with the patient. See HPI as well for other ROS.  Review of Systems Constitutional: Positive for malaise/fatigue. HENT: Negative. Eyes: Negative. Respiratory: Negative. Cardiovascular: Negative. Gastrointestinal: Negative. Genitourinary: Negative. Musculoskeletal: Negative. Skin: Negative. Neurological: Negative. Endo/Heme/Allergies: Negative. Psychiatric/Behavioral: Negative.   Medical History: Past Medical History: Diagnosis Date Diabetes mellitus without complication (CMS-HCC) GERD (gastroesophageal reflux disease)  Patient Active Problem List Diagnosis Primary hyperparathyroidism (CMS-HCC)  History reviewed. No pertinent surgical history.  Allergies Allergen Reactions Amoxicillin-Pot Clavulanate Diarrhea severe diahrea-c.diff.  Current Outpatient Medications on  File Prior to Visit Medication Sig Dispense Refill aspirin 81 MG EC tablet Take by mouth atorvastatin (LIPITOR) 80 MG tablet Take 80 mg by mouth at bedtime carvediloL (COREG) 3.125 MG tablet Take 1 tablet by mouth 2 (two) times daily cholecalciferol (VITAMIN D3) 2,000 unit tablet Take by mouth dextrin (FIBER POWDER ORAL) Take 2 tablets by mouth once daily fluticasone propionate (FLONASE) 50 mcg/actuation nasal spray Place 2 sprays into both nostrils once daily garlic [id number] mg Tab Take by mouth lisinopriL (ZESTRIL) 20 MG tablet Take 20 mg by mouth once daily nitroGLYcerin (NITROSTAT) 0.4 MG SL tablet Place 0.4 mg under the tongue every 5 (five) minutes as needed sildenafiL (VIAGRA) 50 MG tablet Take by mouth thiamine (VITAMIN B-1) 100 MG tablet Take 1 tablet by mouth once daily  No current facility-administered medications on file prior to visit.  Family History Problem Relation Age of Onset Coronary Artery Disease (Blocked arteries around heart) Father Coronary Artery Disease (Blocked arteries around heart) Brother   Social History  Tobacco Use Smoking Status Former Types: Cigarettes Smokeless Tobacco Never   Social History  Socioeconomic History Marital status: Married Tobacco Use Smoking status: Former Types: Cigarettes Smokeless tobacco: Never Substance and Sexual Activity Alcohol use: Not Currently Drug use: Never  Objective:  Vitals: BP: 118/71 Pulse: 68 Temp: 36.7 C (98 F) SpO2: 97% Weight: 80.5 kg (177 lb 6.4 oz) Height: 177.8 cm (5\' 10" )  Body mass index is 25.45 kg/m.  Physical Exam  GENERAL APPEARANCE Comfortable, no acute issues Development: normal Gross deformities: none  SKIN Rash, lesions, ulcers: none Induration, erythema: none Nodules: none palpable  EYES Conjunctiva and lids: normal Pupils: equal and reactive  EARS, NOSE, MOUTH, THROAT External ears: no lesion or deformity External nose: no lesion or deformity Hearing: grossly  normal  NECK Symmetric: yes Trachea: midline Thyroid: no palpable nodules in the thyroid bed  CHEST Respiratory effort: normal Retraction or  accessory muscle use: no Breath sounds: normal bilaterally Rales, rhonchi, wheeze: none  CARDIOVASCULAR Auscultation: regular rhythm, normal rate Murmurs: none Pulses: radial pulse 2+ palpable Lower extremity edema: none  ABDOMEN Not assessed  GENITOURINARY/RECTAL Not assessed  MUSCULOSKELETAL Station and gait: normal Digits and nails: no clubbing or cyanosis Muscle strength: grossly normal all extremities Range of motion: grossly normal all extremities Deformity: none  LYMPHATIC Cervical: none palpable Supraclavicular: none palpable  PSYCHIATRIC Oriented to person, place, and time: yes Mood and affect: normal for situation Judgment and insight: appropriate for situation   Assessment and Plan:  Primary hyperparathyroidism (CMS-HCC)  Patient is referred by Dr. Loni Beckwith for surgical evaluation and management of primary hyperparathyroidism.  Patient provided with a copy of "Parathyroid Surgery: Treatment for Your Parathyroid Gland Problem", published by [employer], 12 pages. Book reviewed and explained to patient during visit today.  Today we reviewed his clinical history, his laboratory studies, and his bone density scan results. We discussed parathyroid disease and parathyroid adenoma. I provided them with written literature to review at home. Patient has biochemical evidence of primary hyperparathyroidism. We will obtain an ultrasound examination of the neck as well as a nuclear medicine parathyroid scan with sestamibi. Hopefully these will confirm the diagnosis and localize the parathyroid adenoma. If so he will be a good candidate for minimally invasive parathyroidectomy as an outpatient surgical procedure. We discussed the procedure today. We discussed the size and location of the surgical incision. We discussed the  fact that 95% of the time these are single gland disease. We discussed doing this as an outpatient procedure. We discussed his postoperative recovery and returned to work and activities. We discussed potential complications including recurrent laryngeal nerve injury and the possibility of a second gland adenoma. The patient and his wife understand and agree to proceed with further imaging. If the ultrasound and sestamibi scan failed to identify the adenoma, then we will obtain a 4D CT scan of the neck.  Patient will undergo the above studies. We will contact him with the results and make plans for further management at that time.  [name], MD [facility] [place] practice Office: [phone]

## 2022-12-10 ENCOUNTER — Ambulatory Visit (HOSPITAL_BASED_OUTPATIENT_CLINIC_OR_DEPARTMENT_OTHER): Payer: Medicare HMO | Admitting: Anesthesiology

## 2022-12-10 ENCOUNTER — Telehealth: Payer: Self-pay | Admitting: Cardiovascular Disease

## 2022-12-10 ENCOUNTER — Ambulatory Visit (HOSPITAL_COMMUNITY): Payer: Medicare HMO | Admitting: Emergency Medicine

## 2022-12-10 ENCOUNTER — Encounter (HOSPITAL_COMMUNITY): Payer: Self-pay | Admitting: Surgery

## 2022-12-10 ENCOUNTER — Other Ambulatory Visit: Payer: Self-pay

## 2022-12-10 ENCOUNTER — Encounter (HOSPITAL_COMMUNITY)
Admission: RE | Disposition: A | Discharge status: Home / Self Care | Payer: Self-pay | Source: Ambulatory Visit | Attending: Surgery

## 2022-12-10 ENCOUNTER — Ambulatory Visit (HOSPITAL_COMMUNITY)
Admission: RE | Admit: 2022-12-10 | Discharge: 2022-12-10 | Disposition: A | Discharge status: Home / Self Care | Payer: Medicare HMO | Source: Ambulatory Visit | Attending: Surgery | Admitting: Surgery

## 2022-12-10 DIAGNOSIS — I10 Essential (primary) hypertension: Secondary | ICD-10-CM | POA: Diagnosis not present

## 2022-12-10 DIAGNOSIS — I252 Old myocardial infarction: Secondary | ICD-10-CM

## 2022-12-10 DIAGNOSIS — D351 Benign neoplasm of parathyroid gland: Secondary | ICD-10-CM | POA: Diagnosis not present

## 2022-12-10 DIAGNOSIS — Z955 Presence of coronary angioplasty implant and graft: Secondary | ICD-10-CM | POA: Diagnosis not present

## 2022-12-10 DIAGNOSIS — E210 Primary hyperparathyroidism: Secondary | ICD-10-CM | POA: Diagnosis not present

## 2022-12-10 DIAGNOSIS — E119 Type 2 diabetes mellitus without complications: Secondary | ICD-10-CM | POA: Diagnosis not present

## 2022-12-10 DIAGNOSIS — Z79899 Other long term (current) drug therapy: Secondary | ICD-10-CM | POA: Insufficient documentation

## 2022-12-10 DIAGNOSIS — I2510 Atherosclerotic heart disease of native coronary artery without angina pectoris: Secondary | ICD-10-CM | POA: Diagnosis not present

## 2022-12-10 DIAGNOSIS — Z7984 Long term (current) use of oral hypoglycemic drugs: Secondary | ICD-10-CM | POA: Diagnosis not present

## 2022-12-10 DIAGNOSIS — M810 Age-related osteoporosis without current pathological fracture: Secondary | ICD-10-CM | POA: Diagnosis not present

## 2022-12-10 HISTORY — PX: PARATHYROIDECTOMY: SHX19

## 2022-12-10 LAB — GLUCOSE, CAPILLARY
Glucose-Capillary: 170 mg/dL — ABNORMAL HIGH (ref 70–99)
Glucose-Capillary: 168 mg/dL — ABNORMAL HIGH (ref 70–99)

## 2022-12-10 SURGERY — PARATHYROIDECTOMY
Anesthesia: General | Site: Neck

## 2022-12-10 MED ORDER — ONDANSETRON HCL 4 MG/2ML IJ SOLN: 4.0000 mg | Freq: Once | INTRAMUSCULAR | Status: DC | PRN

## 2022-12-10 MED ORDER — ACETAMINOPHEN 500 MG PO TABS
1000.0000 mg | ORAL_TABLET | Freq: Once | ORAL | Status: AC
  Administered 2022-12-10: 1000 mg via ORAL
  Filled 2022-12-10: qty 2

## 2022-12-10 MED ORDER — 0.9 % SODIUM CHLORIDE (POUR BTL) OPTIME
TOPICAL | Status: DC | PRN
  Administered 2022-12-10: 1000 mL

## 2022-12-10 MED ORDER — DEXAMETHASONE SODIUM PHOSPHATE 10 MG/ML IJ SOLN
INTRAMUSCULAR | Status: AC
  Filled 2022-12-10: qty 1

## 2022-12-10 MED ORDER — TRAMADOL HCL 50 MG PO TABS: 50.0000 mg | ORAL_TABLET | Freq: Four times a day (QID) | ORAL | 0 refills | Status: DC | PRN

## 2022-12-10 MED ORDER — FENTANYL CITRATE (PF) 100 MCG/2ML IJ SOLN
INTRAMUSCULAR | Status: AC
  Filled 2022-12-10: qty 2

## 2022-12-10 MED ORDER — CHLORHEXIDINE GLUCONATE CLOTH 2 % EX PADS: 6.0000 | MEDICATED_PAD | Freq: Once | CUTANEOUS | Status: DC

## 2022-12-10 MED ORDER — CIPROFLOXACIN IN D5W 400 MG/200ML IV SOLN
400.0000 mg | INTRAVENOUS | Status: AC
  Administered 2022-12-10: 400 mg via INTRAVENOUS
  Filled 2022-12-10: qty 200

## 2022-12-10 MED ORDER — CARVEDILOL 3.125 MG PO TABS
3.1250 mg | ORAL_TABLET | Freq: Once | ORAL | Status: AC
  Administered 2022-12-10: 3.125 mg via ORAL
  Filled 2022-12-10: qty 1

## 2022-12-10 MED ORDER — LACTATED RINGERS IV SOLN: INTRAVENOUS | Status: DC

## 2022-12-10 MED ORDER — ONDANSETRON HCL 4 MG/2ML IJ SOLN
INTRAMUSCULAR | Status: AC
  Filled 2022-12-10: qty 2

## 2022-12-10 MED ORDER — FENTANYL CITRATE (PF) 100 MCG/2ML IJ SOLN
INTRAMUSCULAR | Status: DC | PRN
  Administered 2022-12-10: 100 ug via INTRAVENOUS

## 2022-12-10 MED ORDER — ONDANSETRON HCL 4 MG/2ML IJ SOLN
INTRAMUSCULAR | Status: DC | PRN
  Administered 2022-12-10: 4 mg via INTRAVENOUS

## 2022-12-10 MED ORDER — SUGAMMADEX SODIUM 500 MG/5ML IV SOLN
INTRAVENOUS | Status: AC
  Filled 2022-12-10: qty 5

## 2022-12-10 MED ORDER — CHLORHEXIDINE GLUCONATE 0.12 % MT SOLN
15.0000 mL | Freq: Once | OROMUCOSAL | Status: AC
  Administered 2022-12-10: 15 mL via OROMUCOSAL

## 2022-12-10 MED ORDER — BUPIVACAINE HCL 0.25 % IJ SOLN
INTRAMUSCULAR | Status: DC | PRN
  Administered 2022-12-10: 7 mL

## 2022-12-10 MED ORDER — ORAL CARE MOUTH RINSE: 15.0000 mL | Freq: Once | OROMUCOSAL | Status: AC

## 2022-12-10 MED ORDER — SUGAMMADEX SODIUM 200 MG/2ML IV SOLN
INTRAVENOUS | Status: DC | PRN
  Administered 2022-12-10: 200 mg via INTRAVENOUS

## 2022-12-10 MED ORDER — BUPIVACAINE HCL 0.25 % IJ SOLN
INTRAMUSCULAR | Status: AC
  Filled 2022-12-10: qty 1

## 2022-12-10 MED ORDER — FENTANYL CITRATE PF 50 MCG/ML IJ SOSY: 25.0000 ug | PREFILLED_SYRINGE | INTRAMUSCULAR | Status: DC | PRN

## 2022-12-10 MED ORDER — LIDOCAINE HCL (CARDIAC) PF 100 MG/5ML IV SOSY
PREFILLED_SYRINGE | INTRAVENOUS | Status: DC | PRN
  Administered 2022-12-10: 100 mg via INTRAVENOUS

## 2022-12-10 MED ORDER — PROPOFOL 10 MG/ML IV BOLUS
INTRAVENOUS | Status: DC | PRN
  Administered 2022-12-10: 170 mg via INTRAVENOUS

## 2022-12-10 MED ORDER — DEXAMETHASONE SODIUM PHOSPHATE 10 MG/ML IJ SOLN
INTRAMUSCULAR | Status: DC | PRN
  Administered 2022-12-10: 10 mg via INTRAVENOUS

## 2022-12-10 MED ORDER — ROCURONIUM BROMIDE 100 MG/10ML IV SOLN
INTRAVENOUS | Status: DC | PRN
  Administered 2022-12-10: 10 mg via INTRAVENOUS
  Administered 2022-12-10: 60 mg via INTRAVENOUS

## 2022-12-10 SURGICAL SUPPLY — 37 items
ADH SKN CLS APL DERMABOND .7 (GAUZE/BANDAGES/DRESSINGS) ×1
ADH SKN CLS LQ APL DERMABOND (GAUZE/BANDAGES/DRESSINGS) ×1
APL PRP STRL LF DISP 70% ISPRP (MISCELLANEOUS) ×1
ATTRACTOMAT 16X20 MAGNETIC DRP (DRAPES) ×1 IMPLANT
BAG COUNTER SPONGE SURGICOUNT (BAG) ×1 IMPLANT
BAG SPNG CNTER NS LX DISP (BAG) ×1
BLADE SURG 15 STRL LF DISP TIS (BLADE) ×1 IMPLANT
BLADE SURG 15 STRL SS (BLADE) ×1
CHLORAPREP W/TINT 26 (MISCELLANEOUS) ×1 IMPLANT
CLIP TI MEDIUM 6 (CLIP) ×2 IMPLANT
CLIP TI WIDE RED SMALL 6 (CLIP) ×2 IMPLANT
COVER SURGICAL LIGHT HANDLE (MISCELLANEOUS) ×1 IMPLANT
DERMABOND ADVANCED .7 DNX12 (GAUZE/BANDAGES/DRESSINGS) ×1 IMPLANT
DERMABOND ADVANCED .7 DNX6 (GAUZE/BANDAGES/DRESSINGS) IMPLANT
DRAPE LAPAROTOMY T 98X78 PEDS (DRAPES) ×1 IMPLANT
DRAPE UTILITY XL STRL (DRAPES) ×1 IMPLANT
ELECT REM PT RETURN 15FT ADLT (MISCELLANEOUS) ×1 IMPLANT
GAUZE 4X4 16PLY ~~LOC~~+RFID DBL (SPONGE) ×1 IMPLANT
GLOVE SURG ORTHO 8.0 STRL STRW (GLOVE) ×1 IMPLANT
GOWN STRL REUS W/ TWL XL LVL3 (GOWN DISPOSABLE) ×3 IMPLANT
GOWN STRL REUS W/TWL XL LVL3 (GOWN DISPOSABLE) ×3
HEMOSTAT SURGICEL 2X4 FIBR (HEMOSTASIS) ×1 IMPLANT
ILLUMINATOR WAVEGUIDE N/F (MISCELLANEOUS) IMPLANT
KIT BASIN OR (CUSTOM PROCEDURE TRAY) ×1 IMPLANT
KIT TURNOVER KIT A (KITS) IMPLANT
NDL HYPO 25X1 1.5 SAFETY (NEEDLE) ×1 IMPLANT
NEEDLE HYPO 25X1 1.5 SAFETY (NEEDLE) ×1 IMPLANT
PACK BASIC VI WITH GOWN DISP (CUSTOM PROCEDURE TRAY) ×1 IMPLANT
PENCIL SMOKE EVACUATOR (MISCELLANEOUS) ×1 IMPLANT
SHEARS HARMONIC 9CM CVD (BLADE) IMPLANT
SUT MNCRL AB 4-0 PS2 18 (SUTURE) ×1 IMPLANT
SUT VIC AB 3-0 SH 18 (SUTURE) ×1 IMPLANT
SYR BULB IRRIG 60ML STRL (SYRINGE) ×1 IMPLANT
SYR CONTROL 10ML LL (SYRINGE) ×1 IMPLANT
TOWEL OR 17X26 10 PK STRL BLUE (TOWEL DISPOSABLE) ×1 IMPLANT
TOWEL OR NON WOVEN STRL DISP B (DISPOSABLE) ×1 IMPLANT
TUBING CONNECTING 10 (TUBING) ×1 IMPLANT

## 2022-12-10 NOTE — Interval H&P Note (Signed)
History and Physical Interval Note:  12/10/2022 8:44 AM  Jesse Jordan  has presented today for surgery, with the diagnosis of PRIMARY HYPERPARATHYROIDISM.  The various methods of treatment have been discussed with the patient and family. After consideration of risks, benefits and other options for treatment, the patient has consented to    Procedure(s) with comments: [place] (N/A) - [place] as a surgical intervention.    The patient's history has been reviewed, patient examined, no change in status, stable for surgery.  I have reviewed the patient's chart and labs.  Questions were answered to the patient's satisfaction.    [name], [facility] [facility] [place] practice Office: [place]

## 2022-12-10 NOTE — Telephone Encounter (Signed)
Patient is requesting provider switch from Dr. Audie Box to Dr. Dellia Cloud to stay in [place] office.

## 2022-12-10 NOTE — Op Note (Signed)
Operative Note  Pre-operative Diagnosis:  primary hyperparathyroidism  Post-operative Diagnosis:  same  Surgeon:  [name], MD  Assistant:  Malachi Pro, PA-C   Procedure:  Neck exploration with parathyroidectomy  Anesthesia:  general  Estimated Blood Loss:  10 cc  Drains: none         Specimen: right inferior parathyroid adenoma to pathology  Indications:  Patient is referred by Dr. Loni Beckwith for surgical evaluation and management of suspected primary hyperparathyroidism. Patient had been noted to have hypercalcemia. This had persisted over the last year to a year and a half. Patient also underwent bone density scanning showing evidence of osteoporosis. Recent laboratory studies show a calcium level of 11.3 and an intact PTH level of 69. PTH level had been as high as 74 last summer. Patient has noted some fatigue. He denies nephrolithiasis. He denies bone and joint pain.  Patient underwent nuclear medicine parathyroid scan which localized a right parathyroid adenoma.  Ultrasound study showed pole echoic nodules posterior to the thyroid lobes bilaterally, possibly representing bilateral adenoma.  Patient now comes to surgery for neck exploration and parathyroidectomy.  Procedure:  The patient was seen in the pre-op holding area. The risks, benefits, complications, treatment options, and expected outcomes were previously discussed with the patient. The patient agreed with the proposed plan and has signed the informed consent form.  The patient was brought to the operating room by the surgical team, identified as Jesse Jordan and the procedure verified. A "time out" was completed and the above information confirmed.  Following administration of general anesthesia the patient is positioned and then prepped and draped in the usual aseptic fashion.  After ascertaining that an adequate level of anesthesia been achieved, a small Kocher incision was made with a #15 blade.  Dissection was  carried through subcutaneous tissues and platysma.  Hemostasis is achieved with the electrocautery.  Skin flaps are developed cephalad and caudad.  Self-retaining retractors were placed for exposure.  Strap muscles are incised in the midline.  Dissection is begun on the left side.  Strap muscles were reflected laterally exposing a normal left thyroid lobe.  Lobe was gently mobilized.  Exploration revealed a normal left superior parathyroid gland.  Further dissection failed to reveal an enlarged parathyroid gland.  There may be a small normal parathyroid gland in the left thyroid thymic tract.  A dry pack was placed on the left side.  Next we exposed the right thyroid lobe.  We reflected strap muscles laterally and mobilized the right lobe.  Exploration revealed an enlarged parathyroid gland in the right inferior position just below the level of the inferior thyroid artery.  This was gently dissected out.  Vascular structures were divided with the harmonic scalpel between small ligaclips.  The gland was excised and submitted to pathology where frozen section confirmed hypercellular parathyroid tissue consistent with parathyroid adenoma.  Further dissection on the right side failed to reveal any enlarged parathyroid tissue.  Again the left side was explored.  After further exploration, no further abnormal parathyroid tissue was identified.  Neck was irrigated with warm saline.  Good hemostasis was noted.  Fibrillar was placed throughout the operative field bilaterally.  Strap muscles were reapproximated in the midline of interrupted 3-0 Vicryl sutures.  Platysma was closed with interrupted 3-0 Vicryl sutures.  Skin was anesthetized with local anesthetic.  Skin edges were reapproximated with a running 4-0 Monocryl subcuticular suture.  Wound was washed and dried and Dermabond was applied as dressing.  Patient  is awakened from anesthesia and transferred to the recovery room in stable condition.  The patient  tolerated the procedure well.   [name], [place] [facility] Office: [phone]

## 2022-12-10 NOTE — Anesthesia Procedure Notes (Signed)
Procedure Name: Intubation Date/Time: 12/10/2022 9:51 AM  Performed by: [place] Manus Rudd, CRNAPre-anesthesia Checklist: Patient identified, Emergency Drugs available, Suction available and Patient being monitored Patient Re-evaluated:Patient Re-evaluated prior to induction Oxygen Delivery Method: Circle system utilized Preoxygenation: Pre-oxygenation with 100% oxygen Induction Type: IV induction Ventilation: Mask ventilation without difficulty Laryngoscope Size: 4 and Mac Grade View: Grade II Tube type: Oral Tube size: 7.5 mm Number of attempts: 1 Airway Equipment and Method: Stylet and Oral airway Placement Confirmation: ETT inserted through vocal cords under direct vision, positive ETCO2 and breath sounds checked- equal and bilateral Secured at: 22 cm Tube secured with: Tape Dental Injury: Teeth and Oropharynx as per pre-operative assessment

## 2022-12-10 NOTE — Discharge Instructions (Addendum)
[facility] [facility] - Dr. Todd Gerkin  THYROID & PARATHYROID SURGERY:  POST-OP INSTRUCTIONS  Always review the instruction sheet provided by the hospital nurse at discharge.  A prescription for pain medication may be sent to your pharmacy at the time of discharge.  Take your pain medication as prescribed.  If narcotic pain medicine is not needed, then you may take acetaminophen (Tylenol) or ibuprofen (Advil) as needed for pain or soreness.  Take your normal home medications as prescribed unless otherwise directed.  If you need a refill on your pain medication, please contact the office during regular business hours.  Prescriptions will not be processed by the office after 5:00PM or on weekends.  Start with a light diet upon arrival home, such as soup and crackers or toast.  Be sure to drink plenty of fluids.  Resume your normal diet the day after surgery.  Most patients will experience some swelling and bruising on the chest and neck area.  Ice packs will help for the first 48 hours after arriving home.  Swelling and bruising will take several days to resolve.   It is common to experience some constipation after surgery.  Increasing fluid intake and taking a stool softener (Colace) will usually help to prevent this problem.  A mild laxative (Milk of Magnesia or Miralax) should be taken according to package directions if there has been no bowel movement after 48 hours.  Dermabond glue covers your incision. This seals the wound and you may shower at any time. The Dermabond will remain in place for about a week.  You may gradually remove the glue when it loosens around the edges.  If you need to loosen the Dermabond for removal, apply a layer of Vaseline to the wound for 15 minutes and then remove with a Kleenex. Your sutures are under the skin and will not show - they will dissolve on their own.  You may resume light daily activities beginning the day after discharge (such as self-care,  walking, climbing stairs), gradually increasing activities as tolerated. You may have sexual intercourse when it is comfortable. Refrain from any heavy lifting or straining until approved by your doctor. You may drive when you no longer are taking prescription pain medication, you can comfortably wear a seatbelt, and you can safely maneuver your car and apply the brakes.  You will see your doctor in the office for a follow-up appointment approximately three weeks after your surgery.  Make sure that you call for this appointment within a day or two after you arrive home to insure a convenient appointment time. Please have any requested laboratory tests performed a few days prior to your office visit so that the results will be available at your follow up appointment.  WHEN TO CALL THE [facility] OFFICE: -- Fever greater than 101.5 -- Inability to urinate -- Nausea and/or vomiting - persistent -- Extreme swelling or bruising -- Continued bleeding from incision -- Increased pain, redness, or drainage from the incision -- Difficulty swallowing or breathing -- Muscle cramping or spasms -- Numbness or tingling in hands or around lips  The clinic staff is available to answer your questions during regular business hours.  Please don't hesitate to call and ask to speak to one of the nurses if you have concerns.  [facility] OFFICE: [phone] (24 hours)  Please sign up for MyChart accounts. This will allow you to communicate directly with my nurse or myself without having to call the office. It will also allow you   to view your test results. You will need to enroll in MyChart for my office ([facility]) and for the hospital ([facility]).  Todd Gerkin, MD [facility] practice 

## 2022-12-10 NOTE — Transfer of Care (Signed)
Immediate Anesthesia Transfer of Care Note  Patient: Jesse Jordan  Procedure(s) Performed: NECK EXPLORATION WITH PARATHYROIDECTOMY (Neck)  Patient Location: PACU  Anesthesia Type:General  Level of Consciousness: awake, alert , and oriented  Airway & Oxygen Therapy: Patient Spontanous Breathing and Patient connected to face mask oxygen  Post-op Assessment: Report given to RN and Post -op Vital signs reviewed and stable  Post vital signs: Reviewed and stable  Last Vitals:  Vitals Value Taken Time  BP 167/114 12/10/22 1130  Temp 36.4 C 12/10/22 1130  Pulse 61 12/10/22 1132  Resp 18 12/10/22 1132  SpO2 99 % 12/10/22 1132  Vitals shown include unvalidated device data.  Last Pain:  Vitals:   12/10/22 1130  TempSrc: Oral  PainSc:       Patients Stated Pain Goal: 5 ([id number] Q000111Q)  Complications: No notable events documented.

## 2022-12-11 ENCOUNTER — Encounter (HOSPITAL_COMMUNITY): Payer: Self-pay | Admitting: Surgery

## 2022-12-11 ENCOUNTER — Ambulatory Visit: Payer: Medicare HMO | Admitting: Cardiovascular Disease

## 2022-12-11 LAB — SURGICAL PATHOLOGY

## 2022-12-11 NOTE — Anesthesia Postprocedure Evaluation (Signed)
Anesthesia Post Note  Patient: Karlton Batson  Procedure(s) Performed: NECK EXPLORATION WITH PARATHYROIDECTOMY (Neck)     Patient location during evaluation: PACU Anesthesia Type: General Level of consciousness: awake and alert Pain management: pain level controlled Vital Signs Assessment: post-procedure vital signs reviewed and stable Respiratory status: spontaneous breathing, nonlabored ventilation, respiratory function stable and patient connected to nasal cannula oxygen Cardiovascular status: blood pressure returned to baseline and stable Postop Assessment: no apparent nausea or vomiting Anesthetic complications: no   No notable events documented.  Last Vitals:  Vitals:   12/10/22 1215 12/10/22 1233  BP: (!) 169/77 137/86  Pulse: (!) 51 (!) 49  Resp: 13 16  Temp:  36.4 C  SpO2: 97% 97%    Last Pain:  Vitals:   12/10/22 1233  TempSrc: Oral  PainSc: 0-No pain   Pain Goal: Patients Stated Pain Goal: 5 (12/10/22 0701)                 Santa Lighter

## 2022-12-13 ENCOUNTER — Ambulatory Visit: Payer: Medicare HMO | Admitting: Cardiovascular Disease

## 2022-12-13 DIAGNOSIS — I2510 Atherosclerotic heart disease of native coronary artery without angina pectoris: Secondary | ICD-10-CM

## 2022-12-13 DIAGNOSIS — E782 Mixed hyperlipidemia: Secondary | ICD-10-CM

## 2022-12-27 ENCOUNTER — Ambulatory Visit: Payer: Medicare HMO | Attending: Internal Medicine | Admitting: Internal Medicine

## 2022-12-27 ENCOUNTER — Encounter: Payer: Self-pay | Admitting: Internal Medicine

## 2022-12-27 VITALS — BP 130/58 | HR 62 | Ht 70.0 in | Wt 177.2 lb

## 2022-12-27 DIAGNOSIS — I2510 Atherosclerotic heart disease of native coronary artery without angina pectoris: Secondary | ICD-10-CM

## 2022-12-27 DIAGNOSIS — I10 Essential (primary) hypertension: Secondary | ICD-10-CM | POA: Diagnosis not present

## 2022-12-27 DIAGNOSIS — E782 Mixed hyperlipidemia: Secondary | ICD-10-CM | POA: Diagnosis not present

## 2022-12-27 NOTE — Patient Instructions (Signed)
Medication Instructions:  Your physician recommends that you continue on your current medications as directed. Please refer to the Current Medication list given to you today.  *If you need a refill on your cardiac medications before your next appointment, please call your pharmacy*   Lab Work: None If you have labs (blood work) drawn today and your tests are completely normal, you will receive your results only by: MyChart Message (if you have MyChart) OR A paper copy in the mail If you have any lab test that is abnormal or we need to change your treatment, we will call you to review the results.   Testing/Procedures: None   Follow-Up: At [facility], you and your health needs are our priority.  As part of our continuing mission to provide you with exceptional heart care, we have created designated Provider Care Teams.  These Care Teams include your primary Cardiologist (physician) and Advanced Practice Providers (APPs -  Physician Assistants and Nurse Practitioners) who all work together to provide you with the care you need, when you need it.  We recommend signing up for the patient portal called "MyChart".  Sign up information is provided on this After Visit Summary.  MyChart is used to connect with patients for Virtual Visits (Telemedicine).  Patients are able to view lab/test results, encounter notes, upcoming appointments, etc.  Non-urgent messages can be sent to your provider as well.   To learn more about what you can do with MyChart, go to [email or web address].    Your next appointment:   1 year(s)  Provider:   Vishnu Mallipeddi, MD    Other Instructions    

## 2022-12-27 NOTE — Progress Notes (Signed)
Cardiology Office Note  Date: 12/27/2022   ID: Jesse Jordan, DOB November 09, 1950, MRN [id number]  PCP:  [name], MD  Cardiologist:  None Electrophysiologist:  None   Reason for Office Visit: Follow-up of CAD   History of Present Illness: Jesse Jordan is a 72 y.o. male known to have CAD manifested by NSTEMI in 2021 s/p LCx PCI with residual 80% stenosis of D1 and 70% stenosis of ramus with normal LVEF, HTN, DM 2, HLD presented to the cardiology clinic for follow-up visit.  Patient is here for follow-up visit with me. No interval ER visits or hospitalizations for chest pain. Patient denied any rest or exertional chest discomfort, tightness, heaviness or pressure, rest or exertional dyspnea, palpitations, light-headedness, syncope and LE swelling. Compliant with medications and no side-effects. No bleeding complications. Denied smoking cigarettes (quit 50 years ago). He was diagnosed with vestibular neuritis due to which he has dizziness but it sounds more like vertigo.   Past Medical History:  Diagnosis Date   Allergy 1975   Mold, pollen - shots then but now treated by over the counter meds   Anemia    Cataract 2018   Removed   Coronary artery disease    Diabetes mellitus without complication [id number]   Type 2 - medication   GERD (gastroesophageal reflux disease) 1985   Treated by medication   Hypertension 2015   Treated by medication   Localized osteoporosis without current pathological fracture 01/18/2022   Mixed hyperlipidemia 02/06/2008   Myocardial infarction 11/03/2019   Vestibular neuritis, bilateral     Past Surgical History:  Procedure Laterality Date   CARDIAC CATHETERIZATION  11/04/2019   PCI  to LCX   EYE SURGERY  2018   Cataracts removed   PARATHYROIDECTOMY N/A 12/10/2022   Procedure: NECK EXPLORATION WITH PARATHYROIDECTOMY;  Surgeon: [name], MD;  Location: [facility];  Service: General;  Laterality: N/A;  2ND SCRUB PERSON PUJA    Current Outpatient Medications   Medication Sig Dispense Refill   aspirin EC 81 MG tablet Take 81 mg by mouth daily. Swallow whole.     atorvastatin (LIPITOR) 80 MG tablet Take 1 tablet (80 mg total) by mouth at bedtime. 90 tablet 3   carvedilol (COREG) 3.125 MG tablet TAKE 1 TABLET BY MOUTH TWICE A DAY 180 tablet 1   cetirizine (ZYRTEC) 10 MG tablet Take 10 mg by mouth daily.     Cholecalciferol (VITAMIN D) 50 MCG (2000 UT) tablet Take 2,000 Units by mouth daily.     Cyanocobalamin (VITAMIN B-12 PO) Take 1 tablet by mouth daily.     dextromethorphan-guaiFENesin (ROBITUSSIN-DM) 10-100 MG/5ML liquid Take 5 mLs by mouth 1 day or 1 dose. PRN     FARXIGA 10 MG TABS tablet TAKE 1 TABLET BY MOUTH EVERY DAY 30 tablet 5   FIBER PO Take 2 tablets by mouth daily.     fluticasone (FLONASE) 50 MCG/ACT nasal spray SPRAY 2 SPRAYS INTO EACH NOSTRIL EVERY DAY 48 mL 2   GARLIC PO Take 1 capsule by mouth daily.     Ginkgo Biloba (GNP GINGKO BILOBA EXTRACT PO) Take 1 capsule by mouth daily.     lisinopril (ZESTRIL) 20 MG tablet Take 1 tablet (20 mg total) by mouth daily. 90 tablet 3   metFORMIN (GLUCOPHAGE-XR) 500 MG 24 hr tablet Take 1 tablet (500 mg total) by mouth daily with breakfast. 90 tablet 1   nitroGLYCERIN (NITROSTAT) 0.4 MG SL tablet Place 1 tablet (0.4 mg total) under the  tongue every 5 (five) minutes as needed for chest pain. 10 tablet 2   NON FORMULARY Take 1 tablet by mouth daily. Coenzymated B-3     NON FORMULARY Take 1 tablet by mouth daily. Citrust Bermamot extract     sildenafil (VIAGRA) 50 MG tablet Take 1 tablet (50 mg total) by mouth daily as needed for erectile dysfunction. 30 tablet 2   Thiamine HCl (VITAMIN B-1 PO) Take 1 tablet by mouth daily.     traMADol (ULTRAM) 50 MG tablet Take 1 tablet (50 mg total) by mouth every 6 (six) hours as needed for moderate pain. 15 tablet 0   No current facility-administered medications for this visit.   Allergies:  Amoxicillin-pot clavulanate   Social History: The patient   reports that he has never smoked. He has never used smokeless tobacco. He reports that he does not currently use alcohol. He reports that he does not currently use drugs after having used the following drugs: Marijuana.   Family History: The patient's family history includes Cancer in his father; Dementia in his mother; Heart disease in his brother and father; Hyperlipidemia in his mother; Hypertension in his mother; Stroke in his brother.   ROS:  Please see the history of present illness. Otherwise, complete review of systems is positive for none.  All other systems are reviewed and negative.   Physical Exam: VS:  BP (!) 130/58   Pulse 62   Ht 5\' 10"  (1.778 m)   Wt 177 lb 3.2 oz (80.4 kg)   SpO2 94%   BMI 25.43 kg/m , BMI Body mass index is 25.43 kg/m.  Wt Readings from Last 3 Encounters:  12/27/22 177 lb 3.2 oz (80.4 kg)  12/10/22 171 lb 3.2 oz (77.7 kg)  12/06/22 171 lb (77.6 kg)    General: Patient appears comfortable at rest. HEENT: Conjunctiva and lids normal, oropharynx clear with moist mucosa. Neck: Supple, no elevated JVP or carotid bruits, no thyromegaly. Lungs: Clear to auscultation, nonlabored breathing at rest. Cardiac: Regular rate and rhythm, no S3 or significant systolic murmur, no pericardial rub. Abdomen: Soft, nontender, no hepatomegaly, bowel sounds present, no guarding or rebound. Extremities: No pitting edema, distal pulses 2+. Skin: Warm and dry. Musculoskeletal: No kyphosis. Neuropsychiatric: Alert and oriented x3, affect grossly appropriate.  ECG: NSR, RBBB  Recent Labwork: 04/10/2022: ALT 28; AST 22; TSH 3.060 05/14/2022: Magnesium 2.3 12/06/2022: BUN 36; Creatinine, Ser 0.99; Hemoglobin 14.8; Platelets 168; Potassium 3.8; Sodium 134     Component Value Date/Time   CHOL 96 (L) 04/10/2022 0823   TRIG 74 04/10/2022 0823   HDL 31 (L) 04/10/2022 0823   CHOLHDL 3.1 04/10/2022 0823   LDLCALC 49 04/10/2022 0823    Other Studies Reviewed  Today: Echocardiogram from 10/2021 Normal LVEF No valve abnormalities  Assessment and Plan: Patient is a 72 year old M known to have CAD manifested by NSTEMI in 2021 s/p LCx PCI with residual 80% stenosis of D1 and 70% stenosis of ramus with normal LVEF, HTN, DM 2, HLD presented to the cardiology clinic for follow-up visit.  # CAD manifested by NSTEMI in 2021 s/p LCx PCI with residual 80% stenosis of D1 and 70% stenosis of ramus with normal LVEF, currently angina free -Continue aspirin 81 mg once daily and atorvastatin 80 mg nightly -Continue carvedilol 3.125 mg twice daily and lisinopril 20 mg once daily -Continue Farxiga 10 mg once daily -ER precautions for chest pain  # HLD, at goal: Continue atorvastatin 80 mg nightly. LDL 48, 8  months ago. # HTN, controlled: Continue carvedilol 3.125 mg twice daily and lisinopril 20 mg once daily  I have spent a total of 30 minutes with patient reviewing chart, EKGs, labs and examining patient as well as establishing an assessment and plan that was discussed with the patient.  > 50% of time was spent in direct patient care.     Medication Adjustments/Labs and Tests Ordered: Current medicines are reviewed at length with the patient today.  Concerns regarding medicines are outlined above.   Tests Ordered: No orders of the defined types were placed in this encounter.   Medication Changes: No orders of the defined types were placed in this encounter.   Disposition:  Follow up  1 year  Signed, Kinzey Sheriff Fidel Levy, MD, 12/27/2022 8:34 AM    [place] [facility] [address], [place], [place] [address]

## 2022-12-28 DIAGNOSIS — E1165 Type 2 diabetes mellitus with hyperglycemia: Secondary | ICD-10-CM | POA: Diagnosis not present

## 2022-12-28 DIAGNOSIS — E210 Primary hyperparathyroidism: Secondary | ICD-10-CM | POA: Diagnosis not present

## 2022-12-28 DIAGNOSIS — E291 Testicular hypofunction: Secondary | ICD-10-CM | POA: Diagnosis not present

## 2023-01-01 ENCOUNTER — Encounter: Payer: Self-pay | Admitting: "Endocrinology

## 2023-01-01 ENCOUNTER — Ambulatory Visit: Payer: Medicare HMO | Admitting: "Endocrinology

## 2023-01-01 VITALS — BP 100/56 | HR 68 | Ht 70.0 in | Wt 178.4 lb

## 2023-01-01 DIAGNOSIS — E782 Mixed hyperlipidemia: Secondary | ICD-10-CM

## 2023-01-01 DIAGNOSIS — E210 Primary hyperparathyroidism: Secondary | ICD-10-CM | POA: Diagnosis not present

## 2023-01-01 DIAGNOSIS — I10 Essential (primary) hypertension: Secondary | ICD-10-CM

## 2023-01-01 DIAGNOSIS — E291 Testicular hypofunction: Secondary | ICD-10-CM | POA: Insufficient documentation

## 2023-01-01 DIAGNOSIS — M816 Localized osteoporosis [Lequesne]: Secondary | ICD-10-CM

## 2023-01-01 DIAGNOSIS — E1165 Type 2 diabetes mellitus with hyperglycemia: Secondary | ICD-10-CM | POA: Diagnosis not present

## 2023-01-01 NOTE — Progress Notes (Signed)
01/01/2023, 6:56 PM  Endocrinology follow-up note  Jesse Jordan is a 72 y.o.-year-old male, referred by his  Anabel Halon, MD , for evaluation for hypercalcemia/hyperparathyroidism.  He is status post parathyroidectomy on December 10, 2022 with resolution of hypercalcemia and hyperparathyroidism.  Right inferior parathyroid gland was removed.   Past Medical History:  Diagnosis Date   Allergy 1975   Mold, pollen - shots then but now treated by over the counter meds   Anemia    Cataract 2018   Removed   Coronary artery disease    Diabetes mellitus without complication 1995   Type 2 - medication   GERD (gastroesophageal reflux disease) 1985   Treated by medication   Hypertension 2015   Treated by medication   Localized osteoporosis without current pathological fracture 01/18/2022   Mixed hyperlipidemia 02/06/2008   Myocardial infarction 11/03/2019   Vestibular neuritis, bilateral     Past Surgical History:  Procedure Laterality Date   CARDIAC CATHETERIZATION  11/04/2019   PCI  to LCX   EYE SURGERY  2018   Cataracts removed   PARATHYROIDECTOMY N/A 12/10/2022   Procedure: NECK EXPLORATION WITH PARATHYROIDECTOMY;  Surgeon: Darnell Level, MD;  Location: [facility];  Service: General;  Laterality: N/A;  2ND SCRUB PERSON PUJA    Social History   Tobacco Use   Smoking status: Never   Smokeless tobacco: Never   Tobacco comments:    Never  Vaping Use   Vaping Use: Never used  Substance Use Topics   Alcohol use: Not Currently    Comment: Many years ago   Drug use: Not Currently    Types: Marijuana    Comment: Many years ago - 45    Family History  Problem Relation Age of Onset   Hypertension Mother    Dementia Mother    Hyperlipidemia Mother    Cancer Father    Heart disease Father    Stroke Brother    Heart disease Brother     Outpatient Encounter Medications as of 01/01/2023  Medication Sig   aspirin EC 81 MG tablet Take 81 mg by mouth daily. Swallow whole.    atorvastatin (LIPITOR) 80 MG tablet Take 1 tablet (80 mg total) by mouth at bedtime.   carvedilol (COREG) 3.125 MG tablet TAKE 1 TABLET BY MOUTH TWICE A DAY   cetirizine (ZYRTEC) 10 MG tablet Take 10 mg by mouth daily.   Cholecalciferol (VITAMIN D) 50 MCG (2000 UT) tablet Take 2,000 Units by mouth daily.   Cyanocobalamin (VITAMIN B-12 PO) Take 1 tablet by mouth daily.   dextromethorphan-guaiFENesin (ROBITUSSIN-DM) 10-100 MG/5ML liquid Take 5 mLs by mouth 1 day or 1 dose. PRN   FARXIGA 10 MG TABS tablet TAKE 1 TABLET BY MOUTH EVERY DAY   FIBER PO Take 2 tablets by mouth daily.   fluticasone (FLONASE) 50 MCG/ACT nasal spray SPRAY 2 SPRAYS INTO EACH NOSTRIL EVERY DAY   GARLIC PO Take 1 capsule by mouth daily.   Ginkgo Biloba (GNP GINGKO BILOBA EXTRACT PO) Take 1 capsule by mouth daily.   lisinopril (ZESTRIL) 20 MG tablet Take 1 tablet (20 mg total) by mouth daily.   metFORMIN (GLUCOPHAGE-XR) 500 MG 24 hr tablet Take 1 tablet (500 mg total) by mouth daily with breakfast.   nitroGLYCERIN (NITROSTAT) 0.4 MG SL tablet Place 1 tablet (0.4 mg total) under the tongue every 5 (five) minutes as needed for chest pain.   NON FORMULARY Take 1 tablet by mouth daily.  Coenzymated B-3   NON FORMULARY Take 1 tablet by mouth daily. Citrust Bermamot extract   sildenafil (VIAGRA) 50 MG tablet Take 1 tablet (50 mg total) by mouth daily as needed for erectile dysfunction.   Thiamine HCl (VITAMIN B-1 PO) Take 1 tablet by mouth daily.   [DISCONTINUED] traMADol (ULTRAM) 50 MG tablet Take 1 tablet (50 mg total) by mouth every 6 (six) hours as needed for moderate pain.   No facility-administered encounter medications on file as of 01/01/2023.    Allergies  Allergen Reactions   Amoxicillin-Pot Clavulanate     severe diahrea-c.diff.     HPI  Jesse RimaDan Vancleve was diagnosed with hypercalcemia on follow-up since April 2023.  He is known to have hypercalcemia since November 2022. After appropriate workup indicating primary  hyperparathyroidism, he was sent for surgery.  He underwent right inferior parathyroidectomy on December 10, 2022 with benign outcomes.  And his subsequent labs showing resolution of hypercalcemia and hyperparathyroidism. His recent bone density showed osteoporosis of the hips, spine not included due to degenerative joint disease.  No osteoporosis of the arms.    He has no new complaints today.  His previsit labs show significant hypogonadism for which testosterone gel was prescribed during his last visit, patient did not get this supplement for unclear reasons.  He has prior history of hypogonadism which required testosterone replacement.  No prior history of fragility fractures or falls. No history of  kidney stones.  No history of CKD.   he is not on HCTZ or other thiazide therapy.  He is on vitamin D supplement, most recent vitamin D level at 40.  he is not on calcium supplements,  he eats dairy and green, leafy, vegetables on average amounts.  he does not have a family history of hypercalcemia, pituitary tumors, thyroid cancer, or osteoporosis.   I reviewed his chart and he also has a history of type 2 diabetes, history of coronary artery disease, hypertension, hyperlipidemia. His most recent A1c was 7.9%.  He is now on metformin 500 mg p.o. once a day, Farxiga 10 mg p.o. once a day.   ROS:  Constitutional: + Minimally fluctuating body weight, no fatigue, no subjective hyperthermia, no subjective hypothermia Eyes: no blurry vision, no xerophthalmia ENT: no sore throat, no nodules palpated in throat, no dysphagia/odynophagia, no hoarseness   PE: BP (!) 100/56   Pulse 68   Ht 5\' 10"  (1.778 m)   Wt 178 lb 6.4 oz (80.9 kg)   BMI 25.60 kg/m , Body mass index is 25.6 kg/m. Wt Readings from Last 3 Encounters:  01/01/23 178 lb 6.4 oz (80.9 kg)  12/27/22 177 lb 3.2 oz (80.4 kg)  12/10/22 171 lb 3.2 oz (77.7 kg)    Constitutional: + BMI of 25 not in acute distress, normal state of  mind Eyes: PERRLA, EOMI, no exophthalmos ENT: moist mucous membranes, no gross thyromegaly, no gross cervical lymphadenopathy   Diabetic Labs (most recent): Lab Results  Component Value Date   HGBA1C 7.9 (H) 12/06/2022   HGBA1C 8.4 (A) 10/02/2022   HGBA1C 7.0 (H) 04/10/2022     Lipid Panel ( most recent) Lipid Panel     Component Value Date/Time   CHOL 96 (L) 04/10/2022 0823   TRIG 74 04/10/2022 0823   HDL 31 (L) 04/10/2022 0823   CHOLHDL 3.1 04/10/2022 0823   LDLCALC 49 04/10/2022 0823   LABVLDL 16 04/10/2022 0823      Lab Results  Component Value Date   TSH 2.040  12/28/2022   TSH 3.060 04/10/2022   TSH 2.260 08/16/2021   FREET4 0.99 12/28/2022    Recent Results (from the past 2160 hour(s))  Glucose, capillary     Status: Abnormal   Collection Time: 12/06/22  2:18 PM  Result Value Ref Range   Glucose-Capillary 213 (H) 70 - 99 mg/dL    Comment: Glucose reference range applies only to samples taken after fasting for at least 8 hours.  Hemoglobin A1c per protocol     Status: Abnormal   Collection Time: 12/06/22  2:36 PM  Result Value Ref Range   Hgb A1c MFr Bld 7.9 (H) 4.8 - 5.6 %    Comment: (NOTE)         Prediabetes: 5.7 - 6.4         Diabetes: >6.4         Glycemic control for adults with diabetes: <7.0    Mean Plasma Glucose 180 mg/dL    Comment: (NOTE) Performed At: [facility] [address] [place], [place] [id number] Jolene Schimke MD WU:[id number]   Basic metabolic panel per protocol     Status: Abnormal   Collection Time: 12/06/22  2:36 PM  Result Value Ref Range   Sodium 134 (L) 135 - 145 mmol/L   Potassium 3.8 3.5 - 5.1 mmol/L   Chloride 109 98 - 111 mmol/L   CO2 21 (L) 22 - 32 mmol/L   Glucose, Bld 195 (H) 70 - 99 mg/dL    Comment: Glucose reference range applies only to samples taken after fasting for at least 8 hours.   BUN 36 (H) 8 - 23 mg/dL   Creatinine, Ser 9.56 0.61 - 1.24 mg/dL   Calcium 21.3 8.9 - 08.6 mg/dL   GFR,  Estimated >57 >84 mL/min    Comment: (NOTE) Calculated using the CKD-EPI Creatinine Equation (2021)    Anion gap 4 (L) 5 - 15    Comment: Performed at [facility], [address], [place], [place] [address]  CBC per protocol     Status: None   Collection Time: 12/06/22  2:36 PM  Result Value Ref Range   WBC 6.4 4.0 - 10.5 K/uL   RBC 4.57 4.22 - 5.81 MIL/uL   Hemoglobin 14.8 13.0 - 17.0 g/dL   HCT 52.8 41.3 - 24.4 %   MCV 96.3 80.0 - 100.0 fL   MCH 32.4 26.0 - 34.0 pg   MCHC 33.6 30.0 - 36.0 g/dL   RDW 01.0 27.2 - 53.6 %   Platelets 168 150 - 400 K/uL   nRBC 0.0 0.0 - 0.2 %    Comment: Performed at [facility], [address] [place], [place] [address]  Glucose, capillary     Status: Abnormal   Collection Time: 12/10/22  7:13 AM  Result Value Ref Range   Glucose-Capillary 170 (H) 70 - 99 mg/dL    Comment: Glucose reference range applies only to samples taken after fasting for at least 8 hours.  Surgical pathology     Status: None   Collection Time: 12/10/22 10:38 AM  Result Value Ref Range   SURGICAL PATHOLOGY      SURGICAL PATHOLOGY CASE: [id number] PATIENT: Tradarius Fedele Surgical Pathology Report     Clinical History: Primary hyperparathyroidism (crm)     FINAL MICROSCOPIC DIAGNOSIS:  A. RIGHT INFERIOR PARATHYROID, CLINICALLY ADENOMA, PARATHYROIDECTOMY: Hypercellular parathyroid compatible with adenoma   INTRAOPERATIVE DIAGNOSIS:  A. ?  Right inferior parathyroid adenoma: "Hypercellular parathyroid" Intraoperative diagnosis rendered  by Dr. Venetia Night at 10:55 AM on 12/10/2022.  GROSS DESCRIPTION:  The specimen is received fresh for rapid intraoperative consultation and consists of a 0.276 g, 1.1 x 0.8 x 0.5 cm nodule of tan-red soft tissue. The specimen is bisected, and touch preparations are made.  The specimen is entirely submitted for frozen section analysis, and the remnant is resubmitted for permanent in 1  cassette.  Lovey Newcomer 12/10/2022)   Final Diagnosis performed by Jerene Bears, MD.   Electronically signed 12/11/2022 Technical and / or Professional compo nents performed at [facility], [address], [place], [place] [address].  Immunohistochemistry Technical component (if applicable) was performed at [facility]. [address], [place], [place] [address].   IMMUNOHISTOCHEMISTRY DISCLAIMER (if applicable): Some of these immunohistochemical stains may have been developed and the performance characteristics determine by [facility]. Some may not have been cleared or approved by the U.S. Food and Drug Administration. The FDA has determined that such clearance or approval is not necessary. This test is used for clinical purposes. It should not be regarded as investigational or for research. This laboratory is certified under the Clinical Laboratory Improvement Amendments of 1988 (CLIA-88) as qualified to perform high complexity clinical laboratory testing.  The controls stained appropriately.   Glucose, capillary     Status: Abnormal   Collection Time: 12/10/22 11:42 AM  Result Value Ref Range   Glucose-Capillary 168 (H) 70 - 99 mg/dL    Comment: Glucose reference range applies only to samples taken after fasting for at least 8 hours.  Testosterone, Free, Total, SHBG     Status: Abnormal (Preliminary result)   Collection Time: 12/28/22  8:05 AM  Result Value Ref Range   Testosterone 148 (L) 264 - 916 ng/dL    Comment: Adult male reference interval is based on a population of healthy nonobese males (BMI <30) between 24 and 26 years old. Travison, et.al. JCEM [id number]. PMID: 12751700.    Testosterone, Free WILL FOLLOW    Sex Hormone Binding 11.7 (L) 19.3 - 76.4 nmol/L  PTH, intact and calcium     Status: None   Collection Time: 12/28/22  8:05 AM  Result Value Ref Range   PTH 57 15 - 65 pg/mL   PTH Interp Comment      Comment: Interpretation                 Intact PTH    Calcium                                 (pg/mL)      (mg/dL) Normal                          15 - 65     8.6 - 10.2 Primary Hyperparathyroidism         >65          >10.2 Secondary Hyperparathyroidism       >65          <10.2 Non-Parathyroid Hypercalcemia       <65          >10.2 Hypoparathyroidism                  <15          < 8.6 Non-Parathyroid Hypocalcemia    15 - 65          <  8.6   Phosphorus     Status: None   Collection Time: 12/28/22  8:05 AM  Result Value Ref Range   Phosphorus 3.2 2.8 - 4.1 mg/dL  Magnesium     Status: None   Collection Time: 12/28/22  8:05 AM  Result Value Ref Range   Magnesium 2.2 1.6 - 2.3 mg/dL  T4, free     Status: None   Collection Time: 12/28/22  8:05 AM  Result Value Ref Range   Free T4 0.99 0.82 - 1.77 ng/dL  TSH     Status: None   Collection Time: 12/28/22  8:05 AM  Result Value Ref Range   TSH 2.040 0.450 - 4.500 uIU/mL  Comprehensive metabolic panel     Status: Abnormal   Collection Time: 12/28/22  8:05 AM  Result Value Ref Range   Glucose 218 (H) 70 - 99 mg/dL   BUN 19 8 - 27 mg/dL   Creatinine, Ser 1.61 0.76 - 1.27 mg/dL   eGFR 81 >09 UE/AVW/0.98   BUN/Creatinine Ratio 19 10 - 24   Sodium 141 134 - 144 mmol/L   Potassium 4.4 3.5 - 5.2 mmol/L   Chloride 102 96 - 106 mmol/L   CO2 22 20 - 29 mmol/L   Calcium 9.5 8.6 - 10.2 mg/dL   Total Protein 7.4 6.0 - 8.5 g/dL   Albumin 4.7 3.9 - 4.9 g/dL   Globulin, Total 2.7 1.5 - 4.5 g/dL   Albumin/Globulin Ratio 1.7 1.2 - 2.2   Bilirubin Total 0.5 0.0 - 1.2 mg/dL   Alkaline Phosphatase 108 44 - 121 IU/L   AST 16 0 - 40 IU/L   ALT 27 0 - 44 IU/L  VITAMIN D 25 Hydroxy (Vit-D Deficiency, Fractures)     Status: None   Collection Time: 12/28/22  8:05 AM  Result Value Ref Range   Vit D, 25-Hydroxy 40.0 30.0 - 100.0 ng/mL    Comment: Vitamin D deficiency has been defined by the Institute of Medicine and an Endocrine Society practice  guideline as a level of serum 25-OH vitamin D less than 20 ng/mL (1,2). The Endocrine Society went on to further define vitamin D insufficiency as a level between 21 and 29 ng/mL (2). 1. IOM (Institute of Medicine). 2010. Dietary reference    intakes for calcium and D. [place]: The    [employer]. 2. Holick MF, Binkley Nash, Bischoff-Ferrari HA, et al.    Evaluation, treatment, and prevention of vitamin D    deficiency: an Endocrine Society clinical practice    guideline. JCEM. 2011 Jul; 96(7):1911-30.      Bone density study on January 16, 2022 DualFemur Neck Left 01/16/2022 69.2 N/A -2.6 0.737 g/cm2 - -  DualFemur Total Mean 01/16/2022 69.2 N/A -1.8 0.838 g/cm2 - -   Left Forearm Radius 33% 01/16/2022 69.2 N/A 0.6 0.857 g/cm2 - - ASSESSMENT: BMD as determined from Femur Neck Left is 0.737 g/cm2 with a T-score of -2.6. This patient is considered osteoporotic by World Health Organization [facility]) Criteria. The scan quality is good. Lumbar spine was excluded due to advanced degenerative changes.     Assessment: 1. Hypercalcemia / Hyperparathyroidism  2.  Osteoporosis   3.  Type 2 diabetes      4.  Dyslipidemia  5.  Hypogonadism   Plan: We discussed his new labs with him. Patient is recovering from right inferior parathyroidectomy after workup confirmed primary hyperparathyroidism with hypercalcemia.   His most recent calcium is 9.5 and his  PTH is 57.  Surgery was performed by Dr. Gerrit Friends.  -He is on vitamin D supplement, his last Vitamin D was 40. He has osteoporosis, no history of fragility fractures, nephrolithiasis, abdominal pain, major mood disorders.   In light of his presentation with osteoporosis, he was offered treatment for hypogonadism with testosterone in lieu of bisphosphonates.  He did not get his testosterone gel for unclear reasons. His recent bone density is due in April 2025.    He is advised to optimize his vitamin D intake and  exercise.  Regarding his type 2 diabetes: Returns with stable A1c of 7.9%.  I agree with addition of metformin by Dr. Allena Katz.  He is advised to continue metformin 500 mg XR p.o. daily at breakfast along with Farxiga 10 mg p.o. daily at breakfast.    -In light of his metabolic dysfunction manifested by hypertension, hyperlipidemia, type 2 diabetes complicated by coronary artery disease he remains a good candidate for lifestyle medicine.  His engagement is suboptimal so far.  - he acknowledges that there is a room for improvement in his food and drink choices. - Suggestion is made for him to avoid simple carbohydrates  from his diet including Cakes, Sweet Desserts, Ice Cream, Soda (diet and regular), Sweet Tea, Candies, Chips, Cookies, Store Bought Juices, Alcohol , Artificial Sweeteners,  Coffee Creamer, and "Sugar-free" Products, Lemonade. This will help patient to have more stable blood glucose profile and potentially avoid unintended weight gain.  The following Lifestyle Medicine recommendations according to American College of Lifestyle Medicine  [facility]) were discussed and and offered to patient and he  agrees to start the journey:  A. Whole Foods, Plant-Based Nutrition comprising of fruits and vegetables, plant-based proteins, whole-grain carbohydrates was discussed in detail with the patient.   A list for source of those nutrients were also provided to the patient.  Patient will use only water or unsweetened tea for hydration. B.  The need to stay away from risky substances including alcohol, smoking; obtaining 7 to 9 hours of restorative sleep, at least 150 minutes of moderate intensity exercise weekly, the importance of healthy social connections,  and stress management techniques were discussed. C.  A full color page of  Calorie density of various food groups per pound showing examples of each food groups was provided to the patient.   He is advised to maintain close follow-up with his  PMD.   I spent  26  minutes in the care of the patient today including review of labs from CMP, Lipids, Thyroid Function, Hematology (current and previous including abstractions from other facilities); face-to-face time discussing  his blood glucose readings/logs, discussing hypoglycemia and hyperglycemia episodes and symptoms, medications doses, his options of short and long term treatment based on the latest standards of care / guidelines;  discussion about incorporating lifestyle medicine;  and documenting the encounter. Risk reduction counseling performed per USPSTF guidelines to reduce  cardiovascular risk factors.     Please refer to Patient Instructions for Blood Glucose Monitoring and Insulin/Medications Dosing Guide"  in media tab for additional information. Please  also refer to " Patient Self Inventory" in the Media  tab for reviewed elements of pertinent patient history.  Jesse Jordan participated in the discussions, expressed understanding, and voiced agreement with the above plans.  All questions were answered to his satisfaction. he is encouraged to contact clinic should he have any questions or concerns prior to his return visit.   - Return in about 4 months (around  05/03/2023) for F/U with Pre-visit Labs.   Marquis Lunch, MD [facility] [address] [place], [place] [address] Phone: [phone]  Fax: [phone]    This note was partially dictated with voice recognition software. Similar sounding words can be transcribed inadequately or may not  be corrected upon review.  01/01/2023, 6:56 PM

## 2023-01-01 NOTE — Patient Instructions (Signed)

## 2023-01-02 LAB — COMPREHENSIVE METABOLIC PANEL
ALT: 27 IU/L (ref 0–44)
AST: 16 IU/L (ref 0–40)
Albumin/Globulin Ratio: 1.7 (ref 1.2–2.2)
Albumin: 4.7 g/dL (ref 3.9–4.9)
Alkaline Phosphatase: 108 IU/L (ref 44–121)
BUN/Creatinine Ratio: 19 (ref 10–24)
BUN: 19 mg/dL (ref 8–27)
Bilirubin Total: 0.5 mg/dL (ref 0.0–1.2)
CO2: 22 mmol/L (ref 20–29)
Calcium: 9.5 mg/dL (ref 8.6–10.2)
Chloride: 102 mmol/L (ref 96–106)
Creatinine, Ser: 1 mg/dL (ref 0.76–1.27)
Globulin, Total: 2.7 g/dL (ref 1.5–4.5)
Glucose: 218 mg/dL — ABNORMAL HIGH (ref 70–99)
Potassium: 4.4 mmol/L (ref 3.5–5.2)
Sodium: 141 mmol/L (ref 134–144)
Total Protein: 7.4 g/dL (ref 6.0–8.5)
eGFR: 81 mL/min/{1.73_m2} (ref >59)

## 2023-01-02 LAB — TESTOSTERONE, FREE, TOTAL, SHBG
Sex Hormone Binding: 11.7 nmol/L — ABNORMAL LOW (ref 19.3–76.4)
Testosterone, Free: 3.9 pg/mL — ABNORMAL LOW (ref 6.6–18.1)
Testosterone: 148 ng/dL — ABNORMAL LOW (ref 264–916)

## 2023-01-02 LAB — MAGNESIUM: Magnesium: 2.2 mg/dL (ref 1.6–2.3)

## 2023-01-02 LAB — PTH, INTACT AND CALCIUM: PTH: 57 pg/mL (ref 15–65)

## 2023-01-02 LAB — T4, FREE: Free T4: 0.99 ng/dL (ref 0.82–1.77)

## 2023-01-02 LAB — VITAMIN D 25 HYDROXY (VIT D DEFICIENCY, FRACTURES): Vit D, 25-Hydroxy: 40 ng/mL (ref 30.0–100.0)

## 2023-01-02 LAB — PHOSPHORUS: Phosphorus: 3.2 mg/dL (ref 2.8–4.1)

## 2023-01-02 LAB — TSH: TSH: 2.04 u[IU]/mL (ref 0.450–4.500)

## 2023-01-13 ENCOUNTER — Other Ambulatory Visit: Payer: Self-pay | Admitting: Internal Medicine

## 2023-01-13 DIAGNOSIS — E1169 Type 2 diabetes mellitus with other specified complication: Secondary | ICD-10-CM

## 2023-02-13 ENCOUNTER — Other Ambulatory Visit: Payer: Self-pay | Admitting: Internal Medicine

## 2023-02-13 DIAGNOSIS — E119 Type 2 diabetes mellitus without complications: Secondary | ICD-10-CM

## 2023-04-18 ENCOUNTER — Ambulatory Visit (INDEPENDENT_AMBULATORY_CARE_PROVIDER_SITE_OTHER): Payer: Medicare HMO | Admitting: Internal Medicine

## 2023-04-18 ENCOUNTER — Encounter: Payer: Self-pay | Admitting: Internal Medicine

## 2023-04-18 VITALS — BP 127/66 | HR 62 | Ht 70.0 in | Wt 180.2 lb

## 2023-04-18 DIAGNOSIS — I25118 Atherosclerotic heart disease of native coronary artery with other forms of angina pectoris: Secondary | ICD-10-CM

## 2023-04-18 DIAGNOSIS — E559 Vitamin D deficiency, unspecified: Secondary | ICD-10-CM | POA: Diagnosis not present

## 2023-04-18 DIAGNOSIS — E210 Primary hyperparathyroidism: Secondary | ICD-10-CM

## 2023-04-18 DIAGNOSIS — E1165 Type 2 diabetes mellitus with hyperglycemia: Secondary | ICD-10-CM | POA: Diagnosis not present

## 2023-04-18 DIAGNOSIS — E782 Mixed hyperlipidemia: Secondary | ICD-10-CM

## 2023-04-18 DIAGNOSIS — Z0001 Encounter for general adult medical examination with abnormal findings: Secondary | ICD-10-CM

## 2023-04-18 DIAGNOSIS — Z125 Encounter for screening for malignant neoplasm of prostate: Secondary | ICD-10-CM

## 2023-04-18 DIAGNOSIS — J0110 Acute frontal sinusitis, unspecified: Secondary | ICD-10-CM | POA: Diagnosis not present

## 2023-04-18 DIAGNOSIS — I10 Essential (primary) hypertension: Secondary | ICD-10-CM | POA: Diagnosis not present

## 2023-04-18 MED ORDER — ATORVASTATIN CALCIUM 80 MG PO TABS
80.0000 mg | ORAL_TABLET | Freq: Every day | ORAL | 3 refills | Status: AC
Start: 2023-04-18 — End: ?

## 2023-04-18 MED ORDER — CARVEDILOL 3.125 MG PO TABS
3.1250 mg | ORAL_TABLET | Freq: Two times a day (BID) | ORAL | 1 refills | Status: DC
Start: 2023-04-18 — End: 2023-09-19

## 2023-04-18 MED ORDER — LISINOPRIL 20 MG PO TABS
20.0000 mg | ORAL_TABLET | Freq: Every day | ORAL | 3 refills | Status: AC
Start: 2023-04-18 — End: ?

## 2023-04-18 MED ORDER — AZITHROMYCIN 250 MG PO TABS
ORAL_TABLET | ORAL | 0 refills | Status: AC
Start: 2023-04-18 — End: 2023-04-23

## 2023-04-18 NOTE — Patient Instructions (Signed)
Please start taking Azithromycin if your symptoms worsen in the next 3 days. Please use Nasal saline spray for nasal congestion.  Please continue to take medications as prescribed.  Please continue to follow low carb diet and perform moderate exercise/walking at least 150 mins/week.

## 2023-04-18 NOTE — Progress Notes (Signed)
Established Patient Office Visit  Subjective:  Patient ID: Jesse Jordan, male    DOB: [birth year]  Age: 72 y.o. MRN: [id number]  CC:  Chief Complaint  Patient presents with   Annual Exam   Sinusitis    Patient tested positive for covid on Saturday and he feels it has turned into a sinus infection     HPI Adewale Pucillo is a 72 y.o. male with past medical history of CAD s/p stent placement (2021), HTN, GERD, type II DM with HLD who presents for annual physical.  HTN: BP is well-controlled. Takes medications regularly. Patient denies headache, dizziness, chest pain, dyspnea or palpitations.   CAD s/p stent placement in 2021: He denies any chest pain currently. He is on aspirin and statin currently.   Type II DM with HLD: He is on [place] currently.  He denies any polyuria or polydipsia. His last HbA1C is 8.4 now.  He is also on Lipitor 80 mg and Zetia 10 mg daily.   Primary hyperparathyroidism: He had parathyroidectomy in 03/24.  He sees Dr. Fransico Him for it.  Denies any abdominal pain, nausea, vomiting, hematuria or mood disturbance currently.  He reports nasal congestion, postnasal drip and mild fatigue for the last 7 days.  He tested positive for COVID on 07/20.  Denies any fever, chills, dyspnea or wheezing currently.     Past Medical History:  Diagnosis Date   Allergy 1975   Mold, pollen - shots then but now treated by over the counter meds   Anemia    Cataract 2018   Removed   Coronary artery disease    Diabetes mellitus without complication (HCC) 1995   Type 2 - medication   Encounter for general adult medical examination with abnormal findings 04/19/2023   GERD (gastroesophageal reflux disease) 1985   Treated by medication   Hypertension 2015   Treated by medication   Localized osteoporosis without current pathological fracture 01/18/2022   Mixed hyperlipidemia 02/06/2008   Myocardial infarction (HCC) 11/03/2019   Vestibular neuritis, bilateral     Past Surgical History:   Procedure Laterality Date   CARDIAC CATHETERIZATION  11/04/2019   PCI  to LCX   EYE SURGERY  2018   Cataracts removed   PARATHYROIDECTOMY N/A 12/10/2022   Procedure: NECK EXPLORATION WITH PARATHYROIDECTOMY;  Surgeon: Darnell Level, MD;  Location: [facility];  Service: General;  Laterality: N/A;  2ND SCRUB PERSON PUJA    Family History  Problem Relation Age of Onset   Hypertension Mother    Dementia Mother    Hyperlipidemia Mother    Cancer Father    Heart disease Father    Stroke Brother    Heart disease Brother     Social History   Socioeconomic History   Marital status: Married    Spouse name: Not on file   Number of children: 2   Years of education: Not on file   Highest education level: Not on file  Occupational History   Occupation: Retired Presenter, broadcasting  Tobacco Use   Smoking status: Never   Smokeless tobacco: Never   Tobacco comments:    Never  Vaping Use   Vaping status: Never Used  Substance and Sexual Activity   Alcohol use: Not Currently    Comment: Many years ago   Drug use: Not Currently    Types: Marijuana    Comment: Many years ago - 1973   Sexual activity: Yes    Birth control/protection: Other-see comments, None    Comment:  Active some but have ED.  Other Topics Concern   Not on file  Social History Narrative   Not on file   Social Determinants of Health   Financial Resource Strain: Low Risk  (10/18/2022)   Overall Financial Resource Strain (CARDIA)    Difficulty of Paying Living Expenses: Not hard at all  Food Insecurity: No Food Insecurity (10/18/2022)   Hunger Vital Sign    Worried About Running Out of Food in the Last Year: Never true    Ran Out of Food in the Last Year: Never true  Transportation Needs: No Transportation Needs (10/18/2022)   PRAPARE - Administrator, Civil Service (Medical): No    Lack of Transportation (Non-Medical): No  Physical Activity: Sufficiently Active (10/18/2022)   Exercise Vital Sign    Days of  Exercise per Week: 5 days    Minutes of Exercise per Session: 120 min  Stress: No Stress Concern Present (10/18/2022)   [employer] of Occupational Health - Occupational Stress Questionnaire    Feeling of Stress : Not at all  Social Connections: Socially Integrated (10/18/2022)   Social Connection and Isolation Panel [NHANES]    Frequency of Communication with Friends and Family: Twice a week    Frequency of Social Gatherings with Friends and Family: Once a week    Attends Religious Services: More than 4 times per year    Active Member of [employer] or Organizations: No    Attends Engineer, structural: More than 4 times per year    Marital Status: Married  Catering manager Violence: Not At Risk (10/18/2022)   Humiliation, Afraid, Rape, and Kick questionnaire    Fear of Current or Ex-Partner: No    Emotionally Abused: No    Physically Abused: No    Sexually Abused: No    Outpatient Medications Prior to Visit  Medication Sig Dispense Refill   aspirin EC 81 MG tablet Take 81 mg by mouth daily. Swallow whole.     cetirizine (ZYRTEC) 10 MG tablet Take 10 mg by mouth daily.     Cholecalciferol (VITAMIN D) 50 MCG (2000 UT) tablet Take 2,000 Units by mouth daily.     Cyanocobalamin (VITAMIN B-12 PO) Take 1 tablet by mouth daily.     dextromethorphan-guaiFENesin (ROBITUSSIN-DM) 10-100 MG/5ML liquid Take 5 mLs by mouth 1 day or 1 dose. PRN     FARXIGA 10 MG TABS tablet TAKE 1 TABLET BY MOUTH EVERY DAY 30 tablet 5   FIBER PO Take 2 tablets by mouth daily.     fluticasone (FLONASE) 50 MCG/ACT nasal spray SPRAY 2 SPRAYS INTO EACH NOSTRIL EVERY DAY 48 mL 2   GARLIC PO Take 1 capsule by mouth daily.     Ginkgo Biloba (GNP GINGKO BILOBA EXTRACT PO) Take 1 capsule by mouth daily.     metFORMIN (GLUCOPHAGE-XR) 500 MG 24 hr tablet TAKE 1 TABLET BY MOUTH EVERY DAY WITH BREAKFAST 90 tablet 1   nitroGLYCERIN (NITROSTAT) 0.4 MG SL tablet Place 1 tablet (0.4 mg total) under the tongue every 5 (five)  minutes as needed for chest pain. 10 tablet 2   NON FORMULARY Take 1 tablet by mouth daily. Coenzymated B-3     NON FORMULARY Take 1 tablet by mouth daily. Citrust Bermamot extract     sildenafil (VIAGRA) 50 MG tablet Take 1 tablet (50 mg total) by mouth daily as needed for erectile dysfunction. 30 tablet 2   Thiamine HCl (VITAMIN B-1 PO) Take 1 tablet by  mouth daily.     atorvastatin (LIPITOR) 80 MG tablet Take 1 tablet (80 mg total) by mouth at bedtime. 90 tablet 3   carvedilol (COREG) 3.125 MG tablet TAKE 1 TABLET BY MOUTH TWICE A DAY 180 tablet 1   lisinopril (ZESTRIL) 20 MG tablet Take 1 tablet (20 mg total) by mouth daily. 90 tablet 3   No facility-administered medications prior to visit.    Allergies  Allergen Reactions   Amoxicillin-Pot Clavulanate     severe diahrea-c.diff.    ROS Review of Systems  Constitutional:  Negative for chills and fever.  HENT:  Positive for congestion, postnasal drip and sore throat.   Eyes:  Negative for pain and discharge.  Respiratory:  Positive for cough. Negative for shortness of breath.   Cardiovascular:  Negative for chest pain and palpitations.  Gastrointestinal:  Negative for constipation, diarrhea, nausea and vomiting.  Endocrine: Negative for polydipsia and polyuria.  Genitourinary:  Negative for dysuria and hematuria.  Musculoskeletal:  Negative for neck pain and neck stiffness.  Skin:  Negative for rash.  Neurological:  Negative for dizziness, weakness, numbness and headaches.  Psychiatric/Behavioral:  Negative for agitation and behavioral problems.       Objective:    Physical Exam Vitals reviewed.  Constitutional:      General: He is not in acute distress.    Appearance: He is not diaphoretic.  HENT:     Head: Normocephalic and atraumatic.     Nose: Congestion present.     Right Sinus: Frontal sinus tenderness present.     Left Sinus: Frontal sinus tenderness present.     Mouth/Throat:     Mouth: Mucous membranes are  moist.  Eyes:     General: No scleral icterus.    Extraocular Movements: Extraocular movements intact.  Cardiovascular:     Rate and Rhythm: Normal rate and regular rhythm.     Pulses: Normal pulses.     Heart sounds: Normal heart sounds. No murmur heard. Pulmonary:     Breath sounds: Normal breath sounds. No wheezing or rales.  Musculoskeletal:     Cervical back: Neck supple. No tenderness.     Right lower leg: No edema.     Left lower leg: No edema.  Skin:    General: Skin is warm.     Findings: No rash.  Neurological:     General: No focal deficit present.     Mental Status: He is alert and oriented to person, place, and time.     Sensory: No sensory deficit.     Motor: No weakness.  Psychiatric:        Mood and Affect: Mood normal.        Behavior: Behavior normal.     BP 127/66 (BP Location: Left Arm, Patient Position: Sitting, Cuff Size: Normal)   Pulse 62   Ht 5\' 10"  (1.778 m)   Wt 180 lb 3.2 oz (81.7 kg)   SpO2 94%   BMI 25.86 kg/m  Wt Readings from Last 3 Encounters:  04/18/23 180 lb 3.2 oz (81.7 kg)  01/01/23 178 lb 6.4 oz (80.9 kg)  12/27/22 177 lb 3.2 oz (80.4 kg)    Lab Results  Component Value Date   TSH 2.040 12/28/2022   Lab Results  Component Value Date   WBC 6.4 12/06/2022   HGB 14.8 12/06/2022   HCT 44.0 12/06/2022   MCV 96.3 12/06/2022   PLT 168 12/06/2022   Lab Results  Component Value Date  NA 141 12/28/2022   K 4.4 12/28/2022   CO2 22 12/28/2022   GLUCOSE 218 (H) 12/28/2022   BUN 19 12/28/2022   CREATININE 1.00 12/28/2022   BILITOT 0.5 12/28/2022   ALKPHOS 108 12/28/2022   AST 16 12/28/2022   ALT 27 12/28/2022   PROT 7.4 12/28/2022   ALBUMIN 4.7 12/28/2022   CALCIUM 9.5 12/28/2022   ANIONGAP 4 (L) 12/06/2022   EGFR 81 12/28/2022   Lab Results  Component Value Date   CHOL 96 (L) 04/10/2022   Lab Results  Component Value Date   HDL 31 (L) 04/10/2022   Lab Results  Component Value Date   LDLCALC 49 04/10/2022    Lab Results  Component Value Date   TRIG 74 04/10/2022   Lab Results  Component Value Date   CHOLHDL 3.1 04/10/2022   Lab Results  Component Value Date   HGBA1C 7.9 (H) 12/06/2022      Assessment & Plan:   Problem List Items Addressed This Visit       Cardiovascular and Mediastinum   CAD (coronary artery disease) (Chronic)    S/p stent placement On aspirin and statin currently On Coreg Has Nitroglycerin PRN for chest pain Followed by cardiology      Relevant Medications   lisinopril (ZESTRIL) 20 MG tablet   carvedilol (COREG) 3.125 MG tablet   atorvastatin (LIPITOR) 80 MG tablet   Essential hypertension, benign (Chronic)    BP Readings from Last 1 Encounters:  04/18/23 127/66   Well-controlled with lisinopril and Coreg Counseled for compliance with the medications Advised DASH diet and moderate exercise/walking, at least 150 mins/week      Relevant Medications   lisinopril (ZESTRIL) 20 MG tablet   carvedilol (COREG) 3.125 MG tablet   atorvastatin (LIPITOR) 80 MG tablet   Other Relevant Orders   TSH   CMP14+EGFR   CBC with Differential/Platelet     Respiratory   Acute non-recurrent frontal sinusitis    Had recent COVID related URTI Could be postviral symptoms, but if persistent after 3 days with symptomatic treatment, he can start Z-Pak, prescribed Continue Robitussin as needed for cough      Relevant Medications   azithromycin (ZITHROMAX) 250 MG tablet     Endocrine   Hyperparathyroidism, primary (HCC)    S/p parathyroidectomy Last CMP showed wnl calcium Followed by Dr Fransico Him      Type 2 diabetes mellitus with hyperglycemia (HCC)    Lab Results  Component Value Date   HGBA1C 7.9 (H) 12/06/2022   Uncontrolled now, has tried to follow low carb diet since last visit Associated with CAD, HTN and HLD On Farxiga 10 mg daily and Metformin 500 mg QD If  persistently uncontrolled DM, can increase dose of metformin to 1000 mg QD Advised to follow  diabetic diet On statin and ACEi F/u CMP and lipid panel Diabetic eye exam: Advised to follow up with Ophthalmology for diabetic eye exam      Relevant Medications   lisinopril (ZESTRIL) 20 MG tablet   atorvastatin (LIPITOR) 80 MG tablet   Other Relevant Orders   Hemoglobin A1c   CMP14+EGFR   Urine Microalbumin w/creat. ratio     Other   Mixed hyperlipidemia (Chronic)    On Lipitor Check lipid profile      Relevant Medications   lisinopril (ZESTRIL) 20 MG tablet   carvedilol (COREG) 3.125 MG tablet   atorvastatin (LIPITOR) 80 MG tablet   Other Relevant Orders   Lipid panel  Encounter for general adult medical examination with abnormal findings - Primary    Physical exam as documented. Fasting blood tests ordered. Advised to get Tdap vaccines at local pharmacy.      Prostate cancer screening    Ordered PSA after discussing its limitations for prostate cancer screening, including false positive results leading to additional investigations.      Relevant Orders   PSA   Other Visit Diagnoses     Vitamin D deficiency       Relevant Orders   VITAMIN D 25 Hydroxy (Vit-D Deficiency, Fractures)       Meds ordered this encounter  Medications   azithromycin (ZITHROMAX) 250 MG tablet    Sig: Take 2 tablets on day 1, then 1 tablet daily on days 2 through 5    Dispense:  6 tablet    Refill:  0   lisinopril (ZESTRIL) 20 MG tablet    Sig: Take 1 tablet (20 mg total) by mouth daily.    Dispense:  90 tablet    Refill:  3   carvedilol (COREG) 3.125 MG tablet    Sig: Take 1 tablet (3.125 mg total) by mouth 2 (two) times daily.    Dispense:  180 tablet    Refill:  1   atorvastatin (LIPITOR) 80 MG tablet    Sig: Take 1 tablet (80 mg total) by mouth at bedtime.    Dispense:  90 tablet    Refill:  3    Follow-up: Return in about 4 months (around 08/19/2023) for DM and HTN.    Anabel Halon, MD

## 2023-04-18 NOTE — Assessment & Plan Note (Signed)
S/p parathyroidectomy Last CMP showed wnl calcium Followed by Dr Fransico Him

## 2023-04-18 NOTE — Assessment & Plan Note (Signed)
BP Readings from Last 1 Encounters:  04/18/23 127/66   Well-controlled with lisinopril and Coreg Counseled for compliance with the medications Advised DASH diet and moderate exercise/walking, at least 150 mins/week

## 2023-04-18 NOTE — Assessment & Plan Note (Addendum)
S/p stent placement On aspirin and statin currently On Coreg Has Nitroglycerin PRN for chest pain Followed by cardiology

## 2023-04-18 NOTE — Assessment & Plan Note (Addendum)
On Lipitor Check lipid profile 

## 2023-04-18 NOTE — Assessment & Plan Note (Addendum)
Lab Results  Component Value Date   HGBA1C 7.9 (H) 12/06/2022   Uncontrolled now, has tried to follow low carb diet since last visit Associated with CAD, HTN and HLD On Farxiga 10 mg daily and Metformin 500 mg QD If  persistently uncontrolled DM, can increase dose of metformin to 1000 mg QD Advised to follow diabetic diet On statin and ACEi F/u CMP and lipid panel Diabetic eye exam: Advised to follow up with Ophthalmology for diabetic eye exam

## 2023-04-19 ENCOUNTER — Encounter: Payer: Self-pay | Admitting: Internal Medicine

## 2023-04-19 DIAGNOSIS — Z125 Encounter for screening for malignant neoplasm of prostate: Secondary | ICD-10-CM | POA: Insufficient documentation

## 2023-04-19 DIAGNOSIS — E1165 Type 2 diabetes mellitus with hyperglycemia: Secondary | ICD-10-CM | POA: Diagnosis not present

## 2023-04-19 DIAGNOSIS — Z0001 Encounter for general adult medical examination with abnormal findings: Secondary | ICD-10-CM

## 2023-04-19 DIAGNOSIS — I10 Essential (primary) hypertension: Secondary | ICD-10-CM | POA: Diagnosis not present

## 2023-04-19 DIAGNOSIS — E559 Vitamin D deficiency, unspecified: Secondary | ICD-10-CM | POA: Diagnosis not present

## 2023-04-19 DIAGNOSIS — E782 Mixed hyperlipidemia: Secondary | ICD-10-CM | POA: Diagnosis not present

## 2023-04-19 DIAGNOSIS — J0110 Acute frontal sinusitis, unspecified: Secondary | ICD-10-CM | POA: Insufficient documentation

## 2023-04-19 HISTORY — DX: Encounter for general adult medical examination with abnormal findings: Z00.01

## 2023-04-19 NOTE — Assessment & Plan Note (Addendum)
Physical exam as documented. Fasting blood tests ordered. Advised to get Tdap vaccines at local pharmacy.

## 2023-04-19 NOTE — Assessment & Plan Note (Signed)
Ordered PSA after discussing its limitations for prostate cancer screening, including false positive results leading to additional investigations. 

## 2023-04-19 NOTE — Assessment & Plan Note (Signed)
Had recent COVID related URTI Could be postviral symptoms, but if persistent after 3 days with symptomatic treatment, he can start Z-Pak, prescribed Continue Robitussin as needed for cough

## 2023-04-22 ENCOUNTER — Other Ambulatory Visit: Payer: Self-pay | Admitting: Internal Medicine

## 2023-04-22 DIAGNOSIS — E1169 Type 2 diabetes mellitus with other specified complication: Secondary | ICD-10-CM

## 2023-04-22 MED ORDER — METFORMIN HCL ER 500 MG PO TB24
1000.0000 mg | ORAL_TABLET | Freq: Every day | ORAL | 1 refills | Status: DC
Start: 2023-04-22 — End: 2023-12-19

## 2023-05-06 ENCOUNTER — Ambulatory Visit: Payer: Medicare HMO | Admitting: "Endocrinology

## 2023-08-13 ENCOUNTER — Telehealth: Payer: Self-pay

## 2023-08-13 ENCOUNTER — Other Ambulatory Visit: Payer: Self-pay | Admitting: Internal Medicine

## 2023-08-13 DIAGNOSIS — I10 Essential (primary) hypertension: Secondary | ICD-10-CM

## 2023-08-13 DIAGNOSIS — E782 Mixed hyperlipidemia: Secondary | ICD-10-CM

## 2023-08-13 DIAGNOSIS — E1165 Type 2 diabetes mellitus with hyperglycemia: Secondary | ICD-10-CM

## 2023-08-13 NOTE — Telephone Encounter (Signed)
Patient advised.

## 2023-08-13 NOTE — Telephone Encounter (Signed)
Copied from CRM [id number]. Topic: Clinical - Request for Lab/Test Order >> Aug 13, 2023  9:16 AM Herbert Seta B wrote: Reason for CRM: Patient would like labwork ordered before appointment 12/3

## 2023-08-15 ENCOUNTER — Other Ambulatory Visit: Payer: Self-pay | Admitting: Internal Medicine

## 2023-08-15 DIAGNOSIS — E119 Type 2 diabetes mellitus without complications: Secondary | ICD-10-CM

## 2023-08-20 ENCOUNTER — Ambulatory Visit: Payer: Medicare HMO | Admitting: Internal Medicine

## 2023-08-26 DIAGNOSIS — E1165 Type 2 diabetes mellitus with hyperglycemia: Secondary | ICD-10-CM | POA: Diagnosis not present

## 2023-08-26 DIAGNOSIS — E782 Mixed hyperlipidemia: Secondary | ICD-10-CM | POA: Diagnosis not present

## 2023-08-27 ENCOUNTER — Encounter: Payer: Self-pay | Admitting: Internal Medicine

## 2023-08-27 ENCOUNTER — Ambulatory Visit (INDEPENDENT_AMBULATORY_CARE_PROVIDER_SITE_OTHER): Payer: Medicare HMO | Admitting: Internal Medicine

## 2023-08-27 VITALS — BP 126/72 | HR 55 | Ht 70.0 in | Wt 181.4 lb

## 2023-08-27 DIAGNOSIS — I25118 Atherosclerotic heart disease of native coronary artery with other forms of angina pectoris: Secondary | ICD-10-CM | POA: Diagnosis not present

## 2023-08-27 DIAGNOSIS — E782 Mixed hyperlipidemia: Secondary | ICD-10-CM | POA: Diagnosis not present

## 2023-08-27 DIAGNOSIS — E210 Primary hyperparathyroidism: Secondary | ICD-10-CM | POA: Diagnosis not present

## 2023-08-27 DIAGNOSIS — I10 Essential (primary) hypertension: Secondary | ICD-10-CM | POA: Diagnosis not present

## 2023-08-27 DIAGNOSIS — Z7984 Long term (current) use of oral hypoglycemic drugs: Secondary | ICD-10-CM

## 2023-08-27 DIAGNOSIS — E1165 Type 2 diabetes mellitus with hyperglycemia: Secondary | ICD-10-CM

## 2023-08-27 LAB — CMP14+EGFR
ALT: 25 [IU]/L (ref 0–44)
AST: 16 [IU]/L (ref 0–40)
Albumin: 4.6 g/dL (ref 3.9–4.9)
Alkaline Phosphatase: 91 [IU]/L (ref 44–121)
BUN/Creatinine Ratio: 23 (ref 10–24)
BUN: 26 mg/dL (ref 8–27)
Bilirubin Total: 0.4 mg/dL (ref 0.0–1.2)
CO2: 23 mmol/L (ref 20–29)
Calcium: 9.4 mg/dL (ref 8.6–10.2)
Chloride: 104 mmol/L (ref 96–106)
Creatinine, Ser: 1.14 mg/dL (ref 0.76–1.27)
Globulin, Total: 2.4 g/dL (ref 1.5–4.5)
Glucose: 170 mg/dL — ABNORMAL HIGH (ref 70–99)
Potassium: 4.7 mmol/L (ref 3.5–5.2)
Sodium: 145 mmol/L — ABNORMAL HIGH (ref 134–144)
Total Protein: 7 g/dL (ref 6.0–8.5)
eGFR: 69 mL/min/{1.73_m2} (ref >59)

## 2023-08-27 LAB — LIPID PANEL
Chol/HDL Ratio: 4.1 {ratio} (ref 0.0–5.0)
Cholesterol, Total: 160 mg/dL (ref 100–199)
HDL: 39 mg/dL — ABNORMAL LOW (ref >39)
LDL Chol Calc (NIH): 102 mg/dL — ABNORMAL HIGH (ref 0–99)
Triglycerides: 105 mg/dL (ref 0–149)
VLDL Cholesterol Cal: 19 mg/dL (ref 5–40)

## 2023-08-27 LAB — HEMOGLOBIN A1C
Est. average glucose Bld gHb Est-mCnc: 174 mg/dL
Hgb A1c MFr Bld: 7.7 % — ABNORMAL HIGH (ref 4.8–5.6)

## 2023-08-27 MED ORDER — EZETIMIBE 10 MG PO TABS: 10.0000 mg | ORAL_TABLET | Freq: Every day | ORAL | 3 refills | Status: DC

## 2023-08-27 NOTE — Assessment & Plan Note (Signed)
S/p parathyroidectomy Last CMP showed wnl calcium Followed by Dr Fransico Him

## 2023-08-27 NOTE — Progress Notes (Signed)
Established Patient Office Visit  Subjective:  Patient ID: Jesse Jordan, male    DOB: 31-Aug-1951  Age: 72 y.o. MRN: [id number]  CC:  Chief Complaint  Patient presents with   Diabetes    Follow up    Hypertension    Follow up     HPI Jesse Jordan is a 72 y.o. male with past medical history of CAD s/p stent placement (2021), HTN, GERD, type II DM with HLD who presents for f/u of his chronic medical conditions.  HTN: BP is well-controlled. Takes medications regularly. Patient denies headache, dizziness, chest pain, dyspnea or palpitations.   CAD s/p stent placement in 2021: He denies any chest pain currently. He is on aspirin and statin currently.   Type II DM with HLD: He is on Farxiga 10 mg QD and metformin 1000 mg QD currently.  He denies any polyuria or polydipsia. His last HbA1C is 7.7 now.  He is also on Lipitor 80 mg.  Primary hyperparathyroidism: He had parathyroidectomy in 03/24.  He sees Dr. Fransico Him for it.  Denies any abdominal pain, nausea, vomiting, hematuria or mood disturbance currently.    Past Medical History:  Diagnosis Date   Allergy 1975   Mold, pollen - shots then but now treated by over the counter meds   Anemia    Cataract 2018   Removed   Coronary artery disease    Diabetes mellitus without complication (HCC) 1995   Type 2 - medication   Encounter for general adult medical examination with abnormal findings 04/19/2023   GERD (gastroesophageal reflux disease) 1985   Treated by medication   Hypertension 2015   Treated by medication   Localized osteoporosis without current pathological fracture 01/18/2022   Mixed hyperlipidemia 02/06/2008   Myocardial infarction (HCC) 11/03/2019   Vestibular neuritis, bilateral     Past Surgical History:  Procedure Laterality Date   CARDIAC CATHETERIZATION  11/04/2019   PCI  to LCX   EYE SURGERY  2018   Cataracts removed   PARATHYROIDECTOMY N/A 12/10/2022   Procedure: NECK EXPLORATION WITH PARATHYROIDECTOMY;  Surgeon:  Darnell Level, MD;  Location: [facility];  Service: General;  Laterality: N/A;  2ND SCRUB PERSON PUJA    Family History  Problem Relation Age of Onset   Hypertension Mother    Dementia Mother    Hyperlipidemia Mother    Cancer Father    Heart disease Father    Stroke Brother    Heart disease Brother     Social History   Socioeconomic History   Marital status: Married    Spouse name: Not on file   Number of children: 2   Years of education: Not on file   Highest education level: Not on file  Occupational History   Occupation: Retired Presenter, broadcasting  Tobacco Use   Smoking status: Never   Smokeless tobacco: Never   Tobacco comments:    Never  Vaping Use   Vaping status: Never Used  Substance and Sexual Activity   Alcohol use: Not Currently    Comment: Many years ago   Drug use: Not Currently    Types: Marijuana    Comment: Many years ago - 1973   Sexual activity: Yes    Birth control/protection: Other-see comments, None    Comment: Active some but have ED.  Other Topics Concern   Not on file  Social History Narrative   Not on file   Social Determinants of Health   Financial Resource Strain: Low Risk  (  10/18/2022)   Overall Financial Resource Strain (CARDIA)    Difficulty of Paying Living Expenses: Not hard at all  Food Insecurity: No Food Insecurity (10/18/2022)   Hunger Vital Sign    Worried About Running Out of Food in the Last Year: Never true    Ran Out of Food in the Last Year: Never true  Transportation Needs: No Transportation Needs (10/18/2022)   PRAPARE - Administrator, Civil Service (Medical): No    Lack of Transportation (Non-Medical): No  Physical Activity: Sufficiently Active (10/18/2022)   Exercise Vital Sign    Days of Exercise per Week: 5 days    Minutes of Exercise per Session: 120 min  Stress: No Stress Concern Present (10/18/2022)   [employer] of Occupational Health - Occupational Stress Questionnaire    Feeling of Stress : Not  at all  Social Connections: Unknown (12/25/2022)   Received from [employer]   Social Connections    Social Connections: Not on file    Social Connections: Not on file  Intimate Partner Violence: Not At Risk (10/18/2022)   Humiliation, Afraid, Rape, and Kick questionnaire    Fear of Current or Ex-Partner: No    Emotionally Abused: No    Physically Abused: No    Sexually Abused: No    Outpatient Medications Prior to Visit  Medication Sig Dispense Refill   aspirin EC 81 MG tablet Take 81 mg by mouth daily. Swallow whole.     atorvastatin (LIPITOR) 80 MG tablet Take 1 tablet (80 mg total) by mouth at bedtime. 90 tablet 3   carvedilol (COREG) 3.125 MG tablet Take 1 tablet (3.125 mg total) by mouth 2 (two) times daily. 180 tablet 1   cetirizine (ZYRTEC) 10 MG tablet Take 10 mg by mouth daily.     Cholecalciferol (VITAMIN D) 50 MCG (2000 UT) tablet Take 2,000 Units by mouth daily.     Cyanocobalamin (VITAMIN B-12 PO) Take 1 tablet by mouth daily.     dextromethorphan-guaiFENesin (ROBITUSSIN-DM) 10-100 MG/5ML liquid Take 5 mLs by mouth 1 day or 1 dose. PRN     FARXIGA 10 MG TABS tablet TAKE ONE TABLET BY MOUTH EVERY DAY 30 tablet 1   FIBER PO Take 2 tablets by mouth daily.     fluticasone (FLONASE) 50 MCG/ACT nasal spray SPRAY 2 SPRAYS INTO EACH NOSTRIL EVERY DAY 48 mL 2   GARLIC PO Take 1 capsule by mouth daily.     Ginkgo Biloba (GNP GINGKO BILOBA EXTRACT PO) Take 1 capsule by mouth daily.     lisinopril (ZESTRIL) 20 MG tablet Take 1 tablet (20 mg total) by mouth daily. 90 tablet 3   metFORMIN (GLUCOPHAGE-XR) 500 MG 24 hr tablet Take 2 tablets (1,000 mg total) by mouth daily with breakfast. 180 tablet 1   nitroGLYCERIN (NITROSTAT) 0.4 MG SL tablet Place 1 tablet (0.4 mg total) under the tongue every 5 (five) minutes as needed for chest pain. 10 tablet 2   NON FORMULARY Take 1 tablet by mouth daily. Coenzymated B-3     NON FORMULARY Take 1 tablet by mouth daily. Citrust Bermamot extract      sildenafil (VIAGRA) 50 MG tablet Take 1 tablet (50 mg total) by mouth daily as needed for erectile dysfunction. 30 tablet 2   Thiamine HCl (VITAMIN B-1 PO) Take 1 tablet by mouth daily.     No facility-administered medications prior to visit.    Allergies  Allergen Reactions   Amoxicillin-Pot Clavulanate  severe diahrea-c.diff.    ROS Review of Systems  Constitutional:  Negative for chills and fever.  HENT:  Positive for congestion. Negative for postnasal drip and sore throat.   Eyes:  Negative for pain and discharge.  Respiratory:  Negative for cough and shortness of breath.   Cardiovascular:  Negative for chest pain and palpitations.  Gastrointestinal:  Negative for constipation, diarrhea, nausea and vomiting.  Endocrine: Negative for polydipsia and polyuria.  Genitourinary:  Negative for dysuria and hematuria.  Musculoskeletal:  Negative for neck pain and neck stiffness.  Skin:  Negative for rash.  Neurological:  Negative for dizziness, weakness, numbness and headaches.  Psychiatric/Behavioral:  Negative for agitation and behavioral problems.       Objective:    Physical Exam Vitals reviewed.  Constitutional:      General: He is not in acute distress.    Appearance: He is not diaphoretic.  HENT:     Head: Normocephalic and atraumatic.     Nose: Congestion present.     Mouth/Throat:     Mouth: Mucous membranes are moist.  Eyes:     General: No scleral icterus.    Extraocular Movements: Extraocular movements intact.  Cardiovascular:     Rate and Rhythm: Normal rate and regular rhythm.     Pulses: Normal pulses.     Heart sounds: Normal heart sounds. No murmur heard. Pulmonary:     Breath sounds: Normal breath sounds. No wheezing or rales.  Musculoskeletal:     Cervical back: Neck supple. No tenderness.     Right lower leg: No edema.     Left lower leg: No edema.  Skin:    General: Skin is warm.     Findings: No rash.  Neurological:     General: No focal  deficit present.     Mental Status: He is alert and oriented to person, place, and time.     Sensory: No sensory deficit.     Motor: No weakness.  Psychiatric:        Mood and Affect: Mood normal.        Behavior: Behavior normal.     BP 126/72 (BP Location: Right Arm, Patient Position: Sitting, Cuff Size: Normal)   Pulse (!) 55   Ht 5\' 10"  (1.778 m)   Wt 181 lb 6.4 oz (82.3 kg)   SpO2 96%   BMI 26.03 kg/m  Wt Readings from Last 3 Encounters:  08/27/23 181 lb 6.4 oz (82.3 kg)  04/18/23 180 lb 3.2 oz (81.7 kg)  01/01/23 178 lb 6.4 oz (80.9 kg)    Lab Results  Component Value Date   TSH 3.300 04/19/2023   Lab Results  Component Value Date   WBC 5.7 04/19/2023   HGB 15.1 04/19/2023   HCT 45.0 04/19/2023   MCV 96 04/19/2023   PLT 196 04/19/2023   Lab Results  Component Value Date   NA 145 (H) 08/26/2023   K 4.7 08/26/2023   CO2 23 08/26/2023   GLUCOSE 170 (H) 08/26/2023   BUN 26 08/26/2023   CREATININE 1.14 08/26/2023   BILITOT 0.4 08/26/2023   ALKPHOS 91 08/26/2023   AST 16 08/26/2023   ALT 25 08/26/2023   PROT 7.0 08/26/2023   ALBUMIN 4.6 08/26/2023   CALCIUM 9.4 08/26/2023   ANIONGAP 4 (L) 12/06/2022   EGFR 69 08/26/2023   Lab Results  Component Value Date   CHOL 160 08/26/2023   Lab Results  Component Value Date   HDL 39 (L)  08/26/2023   Lab Results  Component Value Date   LDLCALC 102 (H) 08/26/2023   Lab Results  Component Value Date   TRIG 105 08/26/2023   Lab Results  Component Value Date   CHOLHDL 4.1 08/26/2023   Lab Results  Component Value Date   HGBA1C 7.7 (H) 08/26/2023      Assessment & Plan:   Problem List Items Addressed This Visit       Cardiovascular and Mediastinum   CAD (coronary artery disease) with stable angina (Chronic)    S/p stent placement On aspirin and statin currently On Coreg Has Nitroglycerin PRN for chest pain Followed by cardiology      Relevant Medications   ezetimibe (ZETIA) 10 MG tablet    Essential hypertension, benign - Primary (Chronic)    BP Readings from Last 1 Encounters:  08/27/23 126/72   Well-controlled with lisinopril and Coreg Counseled for compliance with the medications Advised DASH diet and moderate exercise/walking, at least 150 mins/week      Relevant Medications   ezetimibe (ZETIA) 10 MG tablet     Endocrine   Hyperparathyroidism, primary (HCC)    S/p parathyroidectomy Last CMP showed wnl calcium Followed by Dr Fransico Him      Type 2 diabetes mellitus with hyperglycemia (HCC)    Lab Results  Component Value Date   HGBA1C 7.7 (H) 08/26/2023   Uncontrolled, but improving - has tried to follow low carb diet since last visit Associated with CAD, HTN and HLD On Farxiga 10 mg daily and Metformin 1000 mg QD He prefers to modify his diet before adding another medicine - advised to follow diabetic diet On statin and ACEi F/u CMP and lipid panel Diabetic eye exam: Advised to follow up with Ophthalmology for diabetic eye exam      Relevant Orders   CMP14+EGFR   Hemoglobin A1c     Other   Mixed hyperlipidemia (Chronic)    On Lipitor Checked lipid profile - LDL 102, not at goal Added Zetia 10 mg QD      Relevant Medications   ezetimibe (ZETIA) 10 MG tablet   Other Relevant Orders   Lipid Profile    Meds ordered this encounter  Medications   ezetimibe (ZETIA) 10 MG tablet    Sig: Take 1 tablet (10 mg total) by mouth daily.    Dispense:  90 tablet    Refill:  3    Follow-up: Return in about 4 months (around 12/26/2023) for DM and HLD.    Anabel Halon, MD

## 2023-08-27 NOTE — Assessment & Plan Note (Signed)
On Lipitor Checked lipid profile - LDL 102, not at goal Added Zetia 10 mg QD

## 2023-08-27 NOTE — Patient Instructions (Signed)
Please start taking Ezetimibe in addition to Atorvastatin for cholesterol.  Please continue to take medications as prescribed.  Please continue to follow low carb diet and perform moderate exercise/walking at least 150 mins/week.  Please get fasting blood tests done before the next visit.

## 2023-08-27 NOTE — Assessment & Plan Note (Signed)
BP Readings from Last 1 Encounters:  08/27/23 126/72   Well-controlled with lisinopril and Coreg Counseled for compliance with the medications Advised DASH diet and moderate exercise/walking, at least 150 mins/week

## 2023-08-27 NOTE — Assessment & Plan Note (Signed)
S/p stent placement On aspirin and statin currently On Coreg Has Nitroglycerin PRN for chest pain Followed by cardiology

## 2023-08-27 NOTE — Assessment & Plan Note (Addendum)
Lab Results  Component Value Date   HGBA1C 7.7 (H) 08/26/2023   Uncontrolled, but improving - has tried to follow low carb diet since last visit Associated with CAD, HTN and HLD On Farxiga 10 mg daily and Metformin 1000 mg QD He prefers to modify his diet before adding another medicine - advised to follow diabetic diet On statin and ACEi F/u CMP and lipid panel Diabetic eye exam: Advised to follow up with Ophthalmology for diabetic eye exam

## 2023-09-17 ENCOUNTER — Other Ambulatory Visit: Payer: Self-pay

## 2023-09-17 ENCOUNTER — Ambulatory Visit
Admission: EM | Admit: 2023-09-17 | Discharge: 2023-09-17 | Disposition: A | Discharge status: Home / Self Care | Payer: Medicare HMO | Attending: Family Medicine | Admitting: Family Medicine

## 2023-09-17 ENCOUNTER — Encounter: Payer: Self-pay | Admitting: Emergency Medicine

## 2023-09-17 DIAGNOSIS — R030 Elevated blood-pressure reading, without diagnosis of hypertension: Secondary | ICD-10-CM | POA: Diagnosis not present

## 2023-09-17 DIAGNOSIS — J0100 Acute maxillary sinusitis, unspecified: Secondary | ICD-10-CM

## 2023-09-17 MED ORDER — AZITHROMYCIN 250 MG PO TABS: ORAL_TABLET | ORAL | 0 refills | Status: DC

## 2023-09-17 NOTE — Discharge Instructions (Signed)
In addition to the antibiotic that I have prescribed, you may take Flonase twice daily, your daily antihistamine and you may use plain Mucinex and Coricidin HBP.  Your blood pressure is a bit elevated today so make sure you are avoiding anything with DM in the label as this can run your blood pressure up.  I also recommend sinus rinses, humidifiers, vapor rubs.

## 2023-09-17 NOTE — ED Triage Notes (Signed)
Pt reports nasal congestion, head congestion x3 weeks. Has been taking otc medication but reports symptoms seem to come and go and are worse at night.

## 2023-09-17 NOTE — ED Provider Notes (Signed)
[facility]    CSN: [id number] Arrival date & time: 09/17/23  7829      History   Chief Complaint Chief Complaint  Patient presents with   Nasal Congestion    HPI Jesse Jordan is a 72 y.o. male.   Patient presenting today with 3-week history of ongoing congestion, productive cough, sinus pain and pressure, fatigue.  Denies fever, chills, chest pain, shortness of breath, abdominal pain, nausea vomiting or diarrhea.  Takes a daily antihistamine for seasonal allergies and has been taking over-the-counter cold and congestion medication with minimal relief.  No known sick contacts recently.    Past Medical History:  Diagnosis Date   Allergy 1975   Mold, pollen - shots then but now treated by over the counter meds   Anemia    Cataract 2018   Removed   Coronary artery disease    Diabetes mellitus without complication (HCC) 1995   Type 2 - medication   Encounter for general adult medical examination with abnormal findings 04/19/2023   GERD (gastroesophageal reflux disease) 1985   Treated by medication   Hypertension 2015   Treated by medication   Localized osteoporosis without current pathological fracture 01/18/2022   Mixed hyperlipidemia 02/06/2008   Myocardial infarction (HCC) 11/03/2019   Vestibular neuritis, bilateral     Patient Active Problem List   Diagnosis Date Noted   Encounter for general adult medical examination with abnormal findings 04/19/2023   Prostate cancer screening 04/19/2023   Acute non-recurrent frontal sinusitis 04/19/2023   Hypogonadism, male 01/01/2023   Localized osteoporosis without current pathological fracture 01/18/2022   Vestibular neuritis 08/16/2021   Type 2 diabetes mellitus with hyperglycemia (HCC) 08/16/2021   Erectile dysfunction 08/16/2021   Hyperparathyroidism, primary (HCC) 01/13/2021   CAD (coronary artery disease) with stable angina 11/06/2019   Hypotestosteronism 04/26/2015   Essential hypertension, benign  10/15/2014   Mixed hyperlipidemia 02/06/2008   Gastroesophageal reflux disease 07/09/2005    Past Surgical History:  Procedure Laterality Date   CARDIAC CATHETERIZATION  11/04/2019   PCI  to LCX   EYE SURGERY  2018   Cataracts removed   PARATHYROIDECTOMY N/A 12/10/2022   Procedure: NECK EXPLORATION WITH PARATHYROIDECTOMY;  Surgeon: Darnell Level, MD;  Location: [facility];  Service: General;  Laterality: N/A;  2ND SCRUB PERSON PUJA       Home Medications    Prior to Admission medications   Medication Sig Start Date End Date Taking? Authorizing Provider  azithromycin (ZITHROMAX) 250 MG tablet Take first 2 tablets together, then 1 every day until finished. 09/17/23  Yes Particia Nearing, PA-C  aspirin EC 81 MG tablet Take 81 mg by mouth daily. Swallow whole.    [provider]  atorvastatin (LIPITOR) 80 MG tablet Take 1 tablet (80 mg total) by mouth at bedtime. 04/18/23   Anabel Halon, MD  carvedilol (COREG) 3.125 MG tablet Take 1 tablet (3.125 mg total) by mouth 2 (two) times daily. 04/18/23   Anabel Halon, MD  cetirizine (ZYRTEC) 10 MG tablet Take 10 mg by mouth daily. 08/14/05   [provider]  Cholecalciferol (VITAMIN D) 50 MCG (2000 UT) tablet Take 2,000 Units by mouth daily.    [provider]  Cyanocobalamin (VITAMIN B-12 PO) Take 1 tablet by mouth daily.    [provider]  dextromethorphan-guaiFENesin (ROBITUSSIN-DM) 10-100 MG/5ML liquid Take 5 mLs by mouth 1 day or 1 dose. PRN    [provider]  ezetimibe (ZETIA) 10 MG tablet Take  1 tablet (10 mg total) by mouth daily. 08/27/23   Anabel Halon, MD  FARXIGA 10 MG TABS tablet TAKE ONE TABLET BY MOUTH EVERY DAY 08/15/23   Anabel Halon, MD  FIBER PO Take 2 tablets by mouth daily.    [provider]  fluticasone (FLONASE) 50 MCG/ACT nasal spray SPRAY 2 SPRAYS INTO EACH NOSTRIL EVERY DAY 11/12/22   Anabel Halon, MD  GARLIC PO Take 1 capsule by mouth daily.     [provider]  Ginkgo Biloba (GNP GINGKO BILOBA EXTRACT PO) Take 1 capsule by mouth daily.    [provider]  lisinopril (ZESTRIL) 20 MG tablet Take 1 tablet (20 mg total) by mouth daily. 04/18/23   Anabel Halon, MD  metFORMIN (GLUCOPHAGE-XR) 500 MG 24 hr tablet Take 2 tablets (1,000 mg total) by mouth daily with breakfast. 04/22/23   Anabel Halon, MD  nitroGLYCERIN (NITROSTAT) 0.4 MG SL tablet Place 1 tablet (0.4 mg total) under the tongue every 5 (five) minutes as needed for chest pain. 12/14/21   Anabel Halon, MD  NON FORMULARY Take 1 tablet by mouth daily. Coenzymated B-3    [provider]  NON FORMULARY Take 1 tablet by mouth daily. Citrust Bermamot extract    [provider]  sildenafil (VIAGRA) 50 MG tablet Take 1 tablet (50 mg total) by mouth daily as needed for erectile dysfunction. 12/14/21   Anabel Halon, MD  Thiamine HCl (VITAMIN B-1 PO) Take 1 tablet by mouth daily.    [provider]    Family History Family History  Problem Relation Age of Onset   Hypertension Mother    Dementia Mother    Hyperlipidemia Mother    Cancer Father    Heart disease Father    Stroke Brother    Heart disease Brother     Social History Social History   Tobacco Use   Smoking status: Never   Smokeless tobacco: Never   Tobacco comments:    Never  Vaping Use   Vaping status: Never Used  Substance Use Topics   Alcohol use: Not Currently    Comment: Many years ago   Drug use: Not Currently    Types: Marijuana    Comment: Many years ago - 1973     Allergies   Amoxicillin-pot clavulanate   Review of Systems Review of Systems Per HPI  Physical Exam Triage Vital Signs ED Triage Vitals  Encounter Vitals Group     BP 09/17/23 0842 (!) 155/72     Systolic BP Percentile --      Diastolic BP Percentile --      Pulse Rate 09/17/23 0842 (!) 59     Resp 09/17/23 0842 20     Temp 09/17/23 0842 98.1 F (36.7 C)     Temp Source  09/17/23 0842 Oral     SpO2 09/17/23 0842 94 %     Weight --      Height --      Head Circumference --      Peak Flow --      Pain Score 09/17/23 0841 0     Pain Loc --      Pain Education --      Exclude from Growth Chart --    No data found.  Updated Vital Signs BP (!) 155/72 (BP Location: Right Arm)   Pulse (!) 59   Temp 98.1 F (36.7 C) (Oral)   Resp 20  SpO2 94%   Visual Acuity Right Eye Distance:   Left Eye Distance:   Bilateral Distance:    Right Eye Near:   Left Eye Near:    Bilateral Near:     Physical Exam Vitals and nursing note reviewed.  Constitutional:      Appearance: He is well-developed.  HENT:     Head: Atraumatic.     Right Ear: External ear normal.     Left Ear: External ear normal.     Nose: Congestion present.     Mouth/Throat:     Pharynx: Posterior oropharyngeal erythema present. No oropharyngeal exudate.  Eyes:     Conjunctiva/sclera: Conjunctivae normal.     Pupils: Pupils are equal, round, and reactive to light.  Cardiovascular:     Rate and Rhythm: Normal rate and regular rhythm.  Pulmonary:     Effort: Pulmonary effort is normal. No respiratory distress.     Breath sounds: No wheezing or rales.  Musculoskeletal:        General: Normal range of motion.     Cervical back: Normal range of motion and neck supple.  Lymphadenopathy:     Cervical: No cervical adenopathy.  Skin:    General: Skin is warm and dry.  Neurological:     Mental Status: He is alert and oriented to person, place, and time.  Psychiatric:        Behavior: Behavior normal.      UC Treatments / Results  Labs (all labs ordered are listed, but only abnormal results are displayed) Labs Reviewed - No data to display  EKG   Radiology No results found.  Procedures Procedures (including critical care time)  Medications Ordered in UC Medications - No data to display  Initial Impression / Assessment and Plan / UC Course  I have reviewed the triage  vital signs and the nursing notes.  Pertinent labs & imaging results that were available during my care of the patient were reviewed by me and considered in my medical decision making (see chart for details).     Given duration and worsening course, will treat with Zithromax, Flonase, sinus rinses, and other over-the-counter remedies.  Continue allergy regimen.  Discussed blood pressure elevation today, avoid any products with DM or Sudafed.  Final Clinical Impressions(s) / UC Diagnoses   Final diagnoses:  Acute non-recurrent maxillary sinusitis  Elevated blood pressure reading     Discharge Instructions      In addition to the antibiotic that I have prescribed, you may take Flonase twice daily, your daily antihistamine and you may use plain Mucinex and Coricidin HBP.  Your blood pressure is a bit elevated today so make sure you are avoiding anything with DM in the label as this can run your blood pressure up.  I also recommend sinus rinses, humidifiers, vapor rubs.    ED Prescriptions     Medication Sig Dispense Auth. Provider   azithromycin (ZITHROMAX) 250 MG tablet Take first 2 tablets together, then 1 every day until finished. 6 tablet Particia Nearing, [place]      PDMP not reviewed this encounter.   Particia Nearing, [place] 09/17/23 1034

## 2023-09-19 ENCOUNTER — Other Ambulatory Visit: Payer: Self-pay | Admitting: Internal Medicine

## 2023-09-19 DIAGNOSIS — I25118 Atherosclerotic heart disease of native coronary artery with other forms of angina pectoris: Secondary | ICD-10-CM

## 2023-10-09 ENCOUNTER — Other Ambulatory Visit: Payer: Self-pay | Admitting: Internal Medicine

## 2023-10-09 DIAGNOSIS — E119 Type 2 diabetes mellitus without complications: Secondary | ICD-10-CM

## 2023-10-23 ENCOUNTER — Ambulatory Visit (INDEPENDENT_AMBULATORY_CARE_PROVIDER_SITE_OTHER): Payer: Medicare HMO

## 2023-10-23 VITALS — Ht 70.0 in | Wt 175.0 lb

## 2023-10-23 DIAGNOSIS — Z0000 Encounter for general adult medical examination without abnormal findings: Secondary | ICD-10-CM | POA: Diagnosis not present

## 2023-10-23 NOTE — Patient Instructions (Signed)
Jesse Jordan , Thank you for taking time to come for your Medicare Wellness Visit. I appreciate your ongoing commitment to your health goals. Please review the following plan we discussed and let me know if I can assist you in the future.   Referrals/Orders/Follow-Ups/Clinician Recommendations:  Next Medicare Annual Wellness Visit:October 27, 2024 at 12:30 pm virtual visit  This is a list of the screening recommended for you and due dates:  Health Maintenance  Topic Date Due   DTaP/Tdap/Td vaccine (1 - Tdap) Never done   Eye exam for diabetics  01/18/2023   COVID-19 Vaccine (1 - 2024-25 season) Never done   Complete foot exam   10/19/2023   Flu Shot  12/23/2023*   Hemoglobin A1C  02/24/2024   Yearly kidney health urinalysis for diabetes  04/18/2024   Yearly kidney function blood test for diabetes  08/25/2024   Medicare Annual Wellness Visit  10/22/2024   Colon Cancer Screening  09/03/2028   Hepatitis C Screening  Completed   HPV Vaccine  Aged Out   Pneumonia Vaccine  Discontinued   Zoster (Shingles) Vaccine  Discontinued  *Topic was postponed. The date shown is not the original due date.    Advanced directives: (ACP Link)Information on Advanced Care Planning can be found at [facility] Advance Health Care Directives Advance Health Care Directives ([email or web address]   Next Medicare Annual Wellness Visit scheduled for next year: yes  Preventive Care 65 Years and Older, Male Preventive care refers to lifestyle choices and visits with your health care provider that can promote health and wellness. Preventive care visits are also called wellness exams. What can I expect for my preventive care visit? Counseling During your preventive care visit, your health care provider may ask about your: Medical history, including: Past medical problems. Family medical history. History of falls. Current health, including: Emotional well-being. Home life and relationship  well-being. Sexual activity. Memory and ability to understand (cognition). Lifestyle, including: Alcohol, nicotine or tobacco, and drug use. Access to firearms. Diet, exercise, and sleep habits. Work and work Astronomer. Sunscreen use. Safety issues such as seatbelt and bike helmet use. Physical exam Your health care provider will check your: Height and weight. These may be used to calculate your BMI (body mass index). BMI is a measurement that tells if you are at a healthy weight. Waist circumference. This measures the distance around your waistline. This measurement also tells if you are at a healthy weight and may help predict your risk of certain diseases, such as type 2 diabetes and high blood pressure. Heart rate and blood pressure. Body temperature. Skin for abnormal spots. What immunizations do I need?  Vaccines are usually given at various ages, according to a schedule. Your health care provider will recommend vaccines for you based on your age, medical history, and lifestyle or other factors, such as travel or where you work. What tests do I need? Screening Your health care provider may recommend screening tests for certain conditions. This may include: Lipid and cholesterol levels. Diabetes screening. This is done by checking your blood sugar (glucose) after you have not eaten for a while (fasting). Hepatitis C test. Hepatitis B test. HIV (human immunodeficiency virus) test. STI (sexually transmitted infection) testing, if you are at risk. Lung cancer screening. Colorectal cancer screening. Prostate cancer screening. Abdominal aortic aneurysm (AAA) screening. You may need this if you are a current or former smoker. Talk with your health care provider about your test results, treatment options, and if  necessary, the need for more tests. Follow these instructions at home: Eating and drinking  Eat a diet that includes fresh fruits and vegetables, whole grains, lean  protein, and low-fat dairy products. Limit your intake of foods with high amounts of sugar, saturated fats, and salt. Take vitamin and mineral supplements as recommended by your health care provider. Do not drink alcohol if your health care provider tells you not to drink. If you drink alcohol: Limit how much you have to 0-2 drinks a day. Know how much alcohol is in your drink. In the [place] one drink equals one 12 oz bottle of beer (355 mL), one 5 oz glass of wine (148 mL), or one 1 oz glass of hard liquor (44 mL). Lifestyle Brush your teeth every morning and night with fluoride toothpaste. Floss one time each day. Exercise for at least 30 minutes 5 or more days each week. Do not use any products that contain nicotine or tobacco. These products include cigarettes, chewing tobacco, and vaping devices, such as e-cigarettes. If you need help quitting, ask your health care provider. Do not use drugs. If you are sexually active, practice safe sex. Use a condom or other form of protection to prevent STIs. Take aspirin only as told by your health care provider. Make sure that you understand how much to take and what form to take. Work with your health care provider to find out whether it is safe and beneficial for you to take aspirin daily. Ask your health care provider if you need to take a cholesterol-lowering medicine (statin). Find healthy ways to manage stress, such as: Meditation, yoga, or listening to music. Journaling. Talking to a trusted person. Spending time with friends and family. Safety Always wear your seat belt while driving or riding in a vehicle. Do not drive: If you have been drinking alcohol. Do not ride with someone who has been drinking. When you are tired or distracted. While texting. If you have been using any mind-altering substances or drugs. Wear a helmet and other protective equipment during sports activities. If you have firearms in your house, make sure you follow  all gun safety procedures. Minimize exposure to UV radiation to reduce your risk of skin cancer. What's next? Visit your health care provider once a year for an annual wellness visit. Ask your health care provider how often you should have your eyes and teeth checked. Stay up to date on all vaccines. This information is not intended to replace advice given to you by your health care provider. Make sure you discuss any questions you have with your health care provider. Document Revised: 03/08/2021 Document Reviewed: 03/08/2021 Elsevier Patient Education  2024 [employer].  Understanding Your Risk for Falls Millions of people have serious injuries from falls each year. It is important to understand your risk of falling. Talk with your health care provider about your risk and what you can do to lower it. If you do have a serious fall, make sure to tell your provider. Falling once raises your risk of falling again. How can falls affect me? Serious injuries from falls are common. These include: Broken bones, such as hip fractures. Head injuries, such as traumatic brain injuries (TBI) or concussions. A fear of falling can cause you to avoid activities and stay at home. This can make your muscles weaker and raise your risk for a fall. What can increase my risk? There are a number of risk factors that increase your risk for falling. The more  risk factors you have, the higher your risk of falling. Serious injuries from a fall happen most often to people who are older than 71 years old. Teenagers and young adults ages 28-29 are also at higher risk. Common risk factors include: Weakness in the lower body. Being generally weak or confused due to long-term (chronic) illness. Dizziness or balance problems. Poor vision. Medicines that cause dizziness or drowsiness. These may include: Medicines for your blood pressure, heart, anxiety, insomnia, or swelling (edema). Pain medicines. Muscle  relaxants. Other risk factors include: Drinking alcohol. Having had a fall in the past. Having foot pain or wearing improper footwear. Working at a dangerous job. Having any of the following in your home: Tripping hazards, such as floor clutter or loose rugs. Poor lighting. Pets. Having dementia or memory loss. What actions can I take to lower my risk of falling?     Physical activity Stay physically fit. Do strength and balance exercises. Consider taking a regular class to build strength and balance. Yoga and tai chi are good options. Vision Have your eyes checked every year and your prescription for glasses or contacts updated as needed. Shoes and walking aids Wear non-skid shoes. Wear shoes that have rubber soles and low heels. Do not wear high heels. Do not walk around the house in socks or slippers. Use a cane or walker as told by your provider. Home safety Attach secure railings on both sides of your stairs. Install grab bars for your bathtub, shower, and toilet. Use a non-skid mat in your bathtub or shower. Attach bath mats securely with double-sided, non-slip rug tape. Use good lighting in all rooms. Keep a flashlight near your bed. Make sure there is a clear path from your bed to the bathroom. Use night-lights. Do not use throw rugs. Make sure all carpeting is taped or tacked down securely. Remove all clutter from walkways and stairways, including extension cords. Repair uneven or broken steps and floors. Avoid walking on icy or slippery surfaces. Walk on the grass instead of on icy or slick sidewalks. Use ice melter to get rid of ice on walkways in the winter. Use a cordless phone. Questions to ask your health care provider Can you help me check my risk for a fall? Do any of my medicines make me more likely to fall? Should I take a vitamin D supplement? What exercises can I do to improve my strength and balance? Should I make an appointment to have my vision  checked? Do I need a bone density test to check for weak bones (osteoporosis)? Would it help to use a cane or a walker? Where to find more information Centers for Disease Control and Prevention, [employer]: TonerPromos.no Community-Based Fall Prevention Programs: TonerPromos.no [employer] on Aging: BaseRingTones.pl Contact a health care provider if: You fall at home. You are afraid of falling at home. You feel weak, drowsy, or dizzy. This information is not intended to replace advice given to you by your health care provider. Make sure you discuss any questions you have with your health care provider. Document Revised: 05/14/2022 Document Reviewed: 05/14/2022 Elsevier Patient Education  2024 [employer].

## 2023-10-23 NOTE — Progress Notes (Signed)
Because this visit was a virtual/telehealth visit,  certain criteria was not obtained, such a blood pressure, CBG if applicable, and timed get up and go. Any medications not marked as "taking" were not mentioned during the medication reconciliation part of the visit. Any vitals not documented were not able to be obtained due to this being a telehealth visit or patient was unable to self-report a recent blood pressure reading due to a lack of equipment at home via telehealth. Vitals that have been documented are verbally provided by the patient.  Interactive audio and video telecommunications were attempted between this provider and patient, however failed, due to patient having technical difficulties OR patient did not have access to video capability.  We continued and completed visit with audio only.  Subjective:   Jesse Jordan is a 73 y.o. male who presents for Medicare Annual/Subsequent preventive examination.  Visit Complete: Virtual I connected with  Jesse Jordan on 10/23/23 by a audio enabled telemedicine application and verified that I am speaking with the correct person using two identifiers.  Patient Location: Home  Provider Location: Home Office  I discussed the limitations of evaluation and management by telemedicine. The patient expressed understanding and agreed to proceed.  Vital Signs: Because this visit was a virtual/telehealth visit, some criteria may be missing or patient reported. Any vitals not documented were not able to be obtained and vitals that have been documented are patient reported.  Patient Medicare AWV questionnaire was completed by the patient on na; I have confirmed that all information answered by patient is correct and no changes since this date.  Cardiac Risk Factors include: advanced age (>6men, >28 women);diabetes mellitus;dyslipidemia;hypertension;male gender     Objective:    Today's Vitals   10/23/23 1506  Weight: 175 lb (79.4 kg)  Height: 5\' 10"   (1.778 m)   Body mass index is 25.11 kg/m.     10/23/2023    3:11 PM 12/10/2022    7:04 AM 12/06/2022    2:13 PM 10/18/2022    9:32 AM 08/24/2021    8:07 AM  Advanced Directives  Does Patient Have a Medical Advance Directive? No No No No No  Would patient like information on creating a medical advance directive? No - Patient declined No - Patient declined  No - Patient declined     Current Medications (verified) Outpatient Encounter Medications as of 10/23/2023  Medication Sig   aspirin EC 81 MG tablet Take 81 mg by mouth daily. Swallow whole.   atorvastatin (LIPITOR) 80 MG tablet Take 1 tablet (80 mg total) by mouth at bedtime.   azithromycin (ZITHROMAX) 250 MG tablet Take first 2 tablets together, then 1 every day until finished.   carvedilol (COREG) 3.125 MG tablet TAKE ONE TABLET BY MOUTH TWICE DAILY   cetirizine (ZYRTEC) 10 MG tablet Take 10 mg by mouth daily.   Cholecalciferol (VITAMIN D) 50 MCG (2000 UT) tablet Take 2,000 Units by mouth daily.   Cyanocobalamin (VITAMIN B-12 PO) Take 1 tablet by mouth daily.   dextromethorphan-guaiFENesin (ROBITUSSIN-DM) 10-100 MG/5ML liquid Take 5 mLs by mouth 1 day or 1 dose. PRN   ezetimibe (ZETIA) 10 MG tablet Take 1 tablet (10 mg total) by mouth daily.   FARXIGA 10 MG TABS tablet TAKE ONE TABLET BY MOUTH EVERY DAY   FIBER PO Take 2 tablets by mouth daily.   fluticasone (FLONASE) 50 MCG/ACT nasal spray SPRAY 2 SPRAYS INTO EACH NOSTRIL EVERY DAY   GARLIC PO Take 1 capsule by mouth daily.  Ginkgo Biloba (GNP GINGKO BILOBA EXTRACT PO) Take 1 capsule by mouth daily.   lisinopril (ZESTRIL) 20 MG tablet Take 1 tablet (20 mg total) by mouth daily.   metFORMIN (GLUCOPHAGE-XR) 500 MG 24 hr tablet Take 2 tablets (1,000 mg total) by mouth daily with breakfast.   nitroGLYCERIN (NITROSTAT) 0.4 MG SL tablet Place 1 tablet (0.4 mg total) under the tongue every 5 (five) minutes as needed for chest pain.   NON FORMULARY Take 1 tablet by mouth daily.  Coenzymated B-3   NON FORMULARY Take 1 tablet by mouth daily. Citrust Bermamot extract   sildenafil (VIAGRA) 50 MG tablet Take 1 tablet (50 mg total) by mouth daily as needed for erectile dysfunction.   Thiamine HCl (VITAMIN B-1 PO) Take 1 tablet by mouth daily.   No facility-administered encounter medications on file as of 10/23/2023.    Allergies (verified) Amoxicillin-pot clavulanate   History: Past Medical History:  Diagnosis Date   Allergy 1975   Mold, pollen - shots then but now treated by over the counter meds   Anemia    Cataract 2018   Removed   Coronary artery disease    Diabetes mellitus without complication (HCC) 1995   Type 2 - medication   Encounter for general adult medical examination with abnormal findings 04/19/2023   GERD (gastroesophageal reflux disease) 1985   Treated by medication   Hypertension 2015   Treated by medication   Localized osteoporosis without current pathological fracture 01/18/2022   Mixed hyperlipidemia 02/06/2008   Myocardial infarction (HCC) 11/03/2019   Vestibular neuritis, bilateral    Past Surgical History:  Procedure Laterality Date   CARDIAC CATHETERIZATION  11/04/2019   PCI  to LCX   EYE SURGERY  2018   Cataracts removed   PARATHYROIDECTOMY N/A 12/10/2022   Procedure: NECK EXPLORATION WITH PARATHYROIDECTOMY;  Surgeon: Darnell Level, MD;  Location: [facility];  Service: General;  Laterality: N/A;  2ND SCRUB PERSON PUJA   Family History  Problem Relation Age of Onset   Hypertension Mother    Dementia Mother    Hyperlipidemia Mother    Cancer Father    Heart disease Father    Stroke Brother    Heart disease Brother    Social History   Socioeconomic History   Marital status: Married    Spouse name: Not on file   Number of children: 2   Years of education: Not on file   Highest education level: Not on file  Occupational History   Occupation: Retired Presenter, broadcasting  Tobacco Use   Smoking status: Never   Smokeless tobacco:  Never   Tobacco comments:    Never  Vaping Use   Vaping status: Never Used  Substance and Sexual Activity   Alcohol use: Not Currently    Comment: Many years ago   Drug use: Not Currently    Types: Marijuana    Comment: Many years ago - 1973   Sexual activity: Yes    Birth control/protection: Other-see comments, None    Comment: Active some but have ED.  Other Topics Concern   Not on file  Social History Narrative   Not on file   Social Drivers of Health   Financial Resource Strain: Low Risk  (10/23/2023)   Overall Financial Resource Strain (CARDIA)    Difficulty of Paying Living Expenses: Not hard at all  Food Insecurity: No Food Insecurity (10/23/2023)   Hunger Vital Sign    Worried About Running Out of Food in the Last  Year: Never true    Ran Out of Food in the Last Year: Never true  Transportation Needs: No Transportation Needs (10/23/2023)   PRAPARE - Administrator, Civil Service (Medical): No    Lack of Transportation (Non-Medical): No  Physical Activity: Sufficiently Active (10/23/2023)   Exercise Vital Sign    Days of Exercise per Week: 3 days    Minutes of Exercise per Session: 120 min  Stress: No Stress Concern Present (10/23/2023)   [employer] of Occupational Health - Occupational Stress Questionnaire    Feeling of Stress : Not at all  Social Connections: Unknown (12/25/2022)   Received from [employer]   Social Connections    Social Connections: Not on file    Social Connections: Not on file    Tobacco Counseling Counseling given: Yes Tobacco comments: Never   Clinical Intake:  Pre-visit preparation completed: Yes  Pain : No/denies pain     BMI - recorded: 25.11 Nutritional Risks: None Diabetes: Yes CBG done?: No Did pt. bring in CBG monitor from home?: No  How often do you need to have someone help you when you read instructions, pamphlets, or other written materials from your doctor or pharmacy?: 1 - Never  Interpreter Needed?:  No  Information entered by :: Maryjean Ka CMA   Activities of Daily Living    10/23/2023    3:07 PM 12/06/2022    2:15 PM  In your present state of health, do you have any difficulty performing the following activities:  Hearing? 0   Vision? 0   Difficulty concentrating or making decisions? 0   Walking or climbing stairs? 0   Dressing or bathing? 0   Doing errands, shopping? 0 0  Preparing Food and eating ? N   Using the Toilet? N   In the past six months, have you accidently leaked urine? N   Do you have problems with loss of bowel control? N   Managing your Medications? N   Managing your Finances? N   Housekeeping or managing your Housekeeping? N     Patient Care Team: Anabel Halon, MD as PCP - General (Internal Medicine) Mallipeddi, Orion Modest, MD as PCP - Cardiology (Cardiology)  Indicate any recent Medical Services you may have received from other than [facility] providers in the past year (date may be approximate).     Assessment:   This is a routine wellness examination for Jesse Jordan.  Hearing/Vision screen Hearing Screening - Comments:: Patient denies any hearing difficulties.   Vision Screening - Comments:: Wears glasses. No up to date on yearly exams. Has an eye exam scheduled for April 2025 with Dr. Coralie Carpen   Goals Addressed             This Visit's Progress    HEMOGLOBIN A1C < 7       To bring my A1C down     Patient Stated       Doreatha Martin building an addition onto my home.        Depression Screen    10/23/2023    3:12 PM 08/27/2023    2:30 PM 04/18/2023    3:52 PM 10/18/2022    8:53 AM 04/16/2022    8:51 AM 02/15/2022   10:05 AM 01/10/2022   10:15 AM  PHQ 2/9 Scores  PHQ - 2 Score 0 0 0 0 0 0 0    Fall Risk    10/23/2023    3:07 PM 08/27/2023  2:30 PM 04/18/2023    3:52 PM 10/18/2022    9:32 AM 10/18/2022    8:53 AM  Fall Risk   Falls in the past year? 0 0 0 0 0  Number falls in past yr: 0 0 0 0 0  Injury with Fall? 0 0 0 0 0  Risk for  fall due to : No Fall Risks No Fall Risks  No Fall Risks   Follow up Falls prevention discussed Falls evaluation completed  Falls evaluation completed     MEDICARE RISK AT HOME: Medicare Risk at Home Any stairs in or around the home?: Yes If so, are there any without handrails?: No Home free of loose throw rugs in walkways, pet beds, electrical cords, etc?: Yes Adequate lighting in your home to reduce risk of falls?: Yes Life alert?: No Use of a cane, walker or w/c?: No Grab bars in the bathroom?: No Shower chair or bench in shower?: No Elevated toilet seat or a handicapped toilet?: No  TIMED UP AND GO:  Was the test performed?  No    Cognitive Function:        10/23/2023    3:08 PM 10/18/2022    9:33 AM 08/24/2021    8:21 AM  6CIT Screen  What Year? 0 points 0 points 0 points  What month? 0 points 0 points 0 points  What time? 0 points 0 points 0 points  Count back from 20 0 points 0 points 0 points  Months in reverse 0 points 0 points 0 points  Repeat phrase 0 points 0 points 0 points  Total Score 0 points 0 points 0 points    Immunizations Immunization History  Administered Date(s) Administered   PPD Test 11/06/2021   Pneumococcal Polysaccharide-23 05/27/2008   Zoster, Live 04/26/2014    TDAP status: Due, Education has been provided regarding the importance of this vaccine. Advised may receive this vaccine at local pharmacy or Health Dept. Aware to provide a copy of the vaccination record if obtained from local pharmacy or Health Dept. Verbalized acceptance and understanding.  Flu Vaccine status: Declined, Education has been provided regarding the importance of this vaccine but patient still declined. Advised may receive this vaccine at local pharmacy or Health Dept. Aware to provide a copy of the vaccination record if obtained from local pharmacy or Health Dept. Verbalized acceptance and understanding.  Pneumococcal vaccine status: Declined,  Education has been  provided regarding the importance of this vaccine but patient still declined. Advised may receive this vaccine at local pharmacy or Health Dept. Aware to provide a copy of the vaccination record if obtained from local pharmacy or Health Dept. Verbalized acceptance and understanding.   Covid-19 vaccine status: Declined, Education has been provided regarding the importance of this vaccine but patient still declined. Advised may receive this vaccine at local pharmacy or Health Dept.or vaccine clinic. Aware to provide a copy of the vaccination record if obtained from local pharmacy or Health Dept. Verbalized acceptance and understanding.  Qualifies for Shingles Vaccine? Yes   Zostavax completed No   Shingrix Vaccine: Patient declined vaccine.  Due, Education has been provided regarding the importance of this vaccine. Advised may receive this vaccine at local pharmacy or Health Dept. Aware to provide a copy of the vaccination record if obtained from local pharmacy or Health Dept. Verbalized acceptance and understanding.   Screening Tests Health Maintenance  Topic Date Due   DTaP/Tdap/Td (1 - Tdap) Never done   OPHTHALMOLOGY EXAM  01/18/2023  COVID-19 Vaccine (1 - 2024-25 season) Never done   Medicare Annual Wellness (AWV)  10/19/2023   FOOT EXAM  10/19/2023   INFLUENZA VACCINE  12/23/2023 (Originally 04/25/2023)   HEMOGLOBIN A1C  02/24/2024   Diabetic kidney evaluation - Urine ACR  04/18/2024   Diabetic kidney evaluation - eGFR measurement  08/25/2024   Colonoscopy  09/03/2028   Hepatitis C Screening  Completed   HPV VACCINES  Aged Out   Pneumonia Vaccine 57+ Years old  Discontinued   Zoster Vaccines- Shingrix  Discontinued    Health Maintenance  Health Maintenance Due  Topic Date Due   DTaP/Tdap/Td (1 - Tdap) Never done   OPHTHALMOLOGY EXAM  01/18/2023   COVID-19 Vaccine (1 - 2024-25 season) Never done   Medicare Annual Wellness (AWV)  10/19/2023   FOOT EXAM  10/19/2023     Colorectal cancer screening: Type of screening: Colonoscopy. Completed 09/03/2018. Repeat every 10 years  Lung Cancer Screening: (Low Dose CT Chest recommended if Age 86-80 years, 20 pack-year currently smoking OR have quit w/in 15years.) does not qualify.   Lung Cancer Screening Referral: na  Additional Screening:  Hepatitis C Screening: does not qualify; Completed   Vision Screening: Recommended annual ophthalmology exams for early detection of glaucoma and other disorders of the eye. Is the patient up to date with their annual eye exam?  No  Who is the provider or what is the name of the office in which the patient attends annual eye exams? Patient sees Dr. Daisy Lazar w/ [facility] [place] office. Has an eye exam scheduled for April 2025  If pt is not established with a provider, would they like to be referred to a provider to establish care? No .   Dental Screening: Recommended annual dental exams for proper oral hygiene  Diabetic Foot Exam: Diabetic Foot Exam: Overdue, Pt has been advised about the importance in completing this exam. Pt is scheduled for diabetic foot exam on provider made aware of need.  Community Resource Referral / Chronic Care Management: CRR required this visit?  No   CCM required this visit?  No     Plan:     I have personally reviewed and noted the following in the patient's chart:   Medical and social history Use of alcohol, tobacco or illicit drugs  Current medications and supplements including opioid prescriptions. Patient is not currently taking opioid prescriptions. Functional ability and status Nutritional status Physical activity Advanced directives List of other physicians Hospitalizations, surgeries, and ER visits in previous 12 months Vitals Screenings to include cognitive, depression, and falls Referrals and appointments  In addition, I have reviewed and discussed with patient certain preventive protocols, quality  metrics, and best practice recommendations. A written personalized care plan for preventive services as well as general preventive health recommendations were provided to patient.     Jordan Hawks Shamaya Kauer, CMA   10/23/2023   After Visit Summary: (MyChart) Due to this being a telephonic visit, the after visit summary with patients personalized plan was offered to patient via MyChart   Nurse Notes: na

## 2023-12-13 ENCOUNTER — Telehealth: Payer: Self-pay | Admitting: Internal Medicine

## 2023-12-13 NOTE — Telephone Encounter (Signed)
 Patient goes to my eye doctor and scheduled for 04.01.2025

## 2023-12-16 ENCOUNTER — Other Ambulatory Visit: Payer: Self-pay | Admitting: Internal Medicine

## 2023-12-16 DIAGNOSIS — E119 Type 2 diabetes mellitus without complications: Secondary | ICD-10-CM

## 2023-12-17 DIAGNOSIS — E782 Mixed hyperlipidemia: Secondary | ICD-10-CM | POA: Diagnosis not present

## 2023-12-17 DIAGNOSIS — E1165 Type 2 diabetes mellitus with hyperglycemia: Secondary | ICD-10-CM | POA: Diagnosis not present

## 2023-12-18 ENCOUNTER — Other Ambulatory Visit: Payer: Self-pay | Admitting: Internal Medicine

## 2023-12-18 DIAGNOSIS — E1169 Type 2 diabetes mellitus with other specified complication: Secondary | ICD-10-CM

## 2023-12-18 DIAGNOSIS — I25118 Atherosclerotic heart disease of native coronary artery with other forms of angina pectoris: Secondary | ICD-10-CM

## 2023-12-18 LAB — CMP14+EGFR
ALT: 34 IU/L (ref 0–44)
AST: 22 IU/L (ref 0–40)
Albumin: 4.6 g/dL (ref 3.8–4.8)
Alkaline Phosphatase: 63 IU/L (ref 44–121)
BUN/Creatinine Ratio: 20 (ref 10–24)
BUN: 23 mg/dL (ref 8–27)
Bilirubin Total: 0.5 mg/dL (ref 0.0–1.2)
CO2: 22 mmol/L (ref 20–29)
Calcium: 8.8 mg/dL (ref 8.6–10.2)
Chloride: 103 mmol/L (ref 96–106)
Creatinine, Ser: 1.14 mg/dL (ref 0.76–1.27)
Globulin, Total: 2.4 g/dL (ref 1.5–4.5)
Glucose: 186 mg/dL — ABNORMAL HIGH (ref 70–99)
Potassium: 4.8 mmol/L (ref 3.5–5.2)
Sodium: 139 mmol/L (ref 134–144)
Total Protein: 7 g/dL (ref 6.0–8.5)
eGFR: 69 mL/min/{1.73_m2} (ref >59)

## 2023-12-18 LAB — HEMOGLOBIN A1C
Est. average glucose Bld gHb Est-mCnc: 214 mg/dL
Hgb A1c MFr Bld: 9.1 % — ABNORMAL HIGH (ref 4.8–5.6)

## 2023-12-18 LAB — LIPID PANEL
Chol/HDL Ratio: 4.1 ratio (ref 0.0–5.0)
Cholesterol, Total: 143 mg/dL (ref 100–199)
HDL: 35 mg/dL — ABNORMAL LOW (ref >39)
LDL Chol Calc (NIH): 88 mg/dL (ref 0–99)
Triglycerides: 108 mg/dL (ref 0–149)
VLDL Cholesterol Cal: 20 mg/dL (ref 5–40)

## 2023-12-24 DIAGNOSIS — H5203 Hypermetropia, bilateral: Secondary | ICD-10-CM | POA: Diagnosis not present

## 2023-12-27 ENCOUNTER — Ambulatory Visit (INDEPENDENT_AMBULATORY_CARE_PROVIDER_SITE_OTHER): Payer: Medicare HMO | Admitting: Internal Medicine

## 2023-12-27 ENCOUNTER — Encounter: Payer: Self-pay | Admitting: Internal Medicine

## 2023-12-27 VITALS — BP 103/61 | HR 62 | Ht 70.0 in | Wt 189.4 lb

## 2023-12-27 DIAGNOSIS — E1165 Type 2 diabetes mellitus with hyperglycemia: Secondary | ICD-10-CM

## 2023-12-27 DIAGNOSIS — Z7984 Long term (current) use of oral hypoglycemic drugs: Secondary | ICD-10-CM | POA: Diagnosis not present

## 2023-12-27 DIAGNOSIS — Z125 Encounter for screening for malignant neoplasm of prostate: Secondary | ICD-10-CM

## 2023-12-27 DIAGNOSIS — E782 Mixed hyperlipidemia: Secondary | ICD-10-CM | POA: Diagnosis not present

## 2023-12-27 DIAGNOSIS — M79642 Pain in left hand: Secondary | ICD-10-CM | POA: Diagnosis not present

## 2023-12-27 DIAGNOSIS — E559 Vitamin D deficiency, unspecified: Secondary | ICD-10-CM

## 2023-12-27 DIAGNOSIS — I2510 Atherosclerotic heart disease of native coronary artery without angina pectoris: Secondary | ICD-10-CM

## 2023-12-27 DIAGNOSIS — I10 Essential (primary) hypertension: Secondary | ICD-10-CM

## 2023-12-27 MED ORDER — GLIPIZIDE 5 MG PO TABS
5.0000 mg | ORAL_TABLET | Freq: Two times a day (BID) | ORAL | 3 refills | Status: DC
Start: 2023-12-27 — End: 2024-04-29

## 2023-12-27 MED ORDER — METFORMIN HCL ER 500 MG PO TB24: 1000.0000 mg | ORAL_TABLET | Freq: Every day | ORAL | 3 refills | Status: DC

## 2023-12-27 NOTE — Assessment & Plan Note (Signed)
 BP Readings from Last 1 Encounters:  12/27/23 103/61   Well-controlled with lisinopril and Coreg Counseled for compliance with the medications Advised DASH diet and moderate exercise/walking, at least 150 mins/week

## 2023-12-27 NOTE — Patient Instructions (Addendum)
 Please start taking Glipizide 5 mg twice daily before meals.  Please continue taking Metformin and Farxiga for now.  Please continue to take medications as prescribed.  Please continue to follow low carb diet and perform moderate exercise/walking at least 150 mins/week.  Please get fasting blood tests done before the next visit.

## 2023-12-27 NOTE — Assessment & Plan Note (Addendum)
 On Lipitor 80 mg QD Checked lipid profile - LDL 88, not at goal, but improved compared to prior Added Zetia 10 mg once daily in the last visit

## 2023-12-27 NOTE — Assessment & Plan Note (Addendum)
 Since 12/13/23 Unclear etiology, but could be related to OA of the hand Can try Voltaren gel ROM and strength of hand intact currently

## 2023-12-27 NOTE — Progress Notes (Addendum)
 Established Patient Office Visit  Subjective:  Patient ID: Jesse Jordan, male    DOB: 1950/11/04  Age: 73 y.o. MRN: [id number]  CC:  Chief Complaint  Patient presents with   Care Management    4 month f/u. Reports some pain on his left hand middle finger .     HPI Jesse Jordan is a 73 y.o. male with past medical history of CAD s/p stent placement (2021), HTN, GERD, type II DM with HLD who presents for f/u of his chronic medical conditions.  HTN: BP is well-controlled. Takes medications regularly. Patient denies headache, dizziness, chest pain, dyspnea or palpitations.   CAD s/p stent placement in 2021: He denies any chest pain currently. He is on aspirin and statin currently.   Type II DM with HLD: He is on Farxiga  10 mg QD and metformin  1000 mg QD currently.  He denies any polyuria or polydipsia. His last HbA1C is 9.1 now.  He has started following treatment for low-carb diet for the last 2 weeks.  His monitoring blood glucose levels are around 150s now. he is also on Lipitor 80 mg.  Primary hyperparathyroidism: He had parathyroidectomy in 03/24.  He sees Dr. Lenis for it.  Denies any abdominal pain, nausea, vomiting, hematuria or mood disturbance currently.  He reports left hand middle finger pain and swelling around PIP joint.  Denies any twisting injury of the hand.  Denies any numbness or tingling of the hand.    Past Medical History:  Diagnosis Date   Allergy 1975   Mold, pollen - shots then but now treated by over the counter meds   Anemia    Cataract 2018   Removed   Coronary artery disease    Diabetes mellitus without complication (HCC) 1995   Type 2 - medication   Encounter for general adult medical examination with abnormal findings 04/19/2023   GERD (gastroesophageal reflux disease) 1985   Treated by medication   Hypertension 2015   Treated by medication   Localized osteoporosis without current pathological fracture 01/18/2022   Mixed hyperlipidemia 02/06/2008    Myocardial infarction (HCC) 11/03/2019   Vestibular neuritis, bilateral     Past Surgical History:  Procedure Laterality Date   CARDIAC CATHETERIZATION  11/04/2019   PCI  to LCX   EYE SURGERY  2018   Cataracts removed   PARATHYROIDECTOMY N/A 12/10/2022   Procedure: NECK EXPLORATION WITH PARATHYROIDECTOMY;  Surgeon: Eletha Boas, MD;  Location: [facility];  Service: General;  Laterality: N/A;  2ND SCRUB PERSON PUJA    Family History  Problem Relation Age of Onset   Hypertension Mother    Dementia Mother    Hyperlipidemia Mother    Cancer Father    Heart disease Father    Stroke Brother    Heart disease Brother     Social History   Socioeconomic History   Marital status: Married    Spouse name: Not on file   Number of children: 2   Years of education: Not on file   Highest education level: Not on file  Occupational History   Occupation: Retired Presenter, broadcasting  Tobacco Use   Smoking status: Never   Smokeless tobacco: Never   Tobacco comments:    Never  Vaping Use   Vaping status: Never Used  Substance and Sexual Activity   Alcohol use: Not Currently    Comment: Many years ago   Drug use: Not Currently    Types: Marijuana    Comment: Many years ago -  1973   Sexual activity: Yes    Birth control/protection: Other-see comments, None    Comment: Active some but have ED.  Other Topics Concern   Not on file  Social History Narrative   Not on file   Social Drivers of Health   Financial Resource Strain: Low Risk  (10/23/2023)   Overall Financial Resource Strain (CARDIA)    Difficulty of Paying Living Expenses: Not hard at all  Food Insecurity: No Food Insecurity (10/23/2023)   Hunger Vital Sign    Worried About Running Out of Food in the Last Year: Never true    Ran Out of Food in the Last Year: Never true  Transportation Needs: No Transportation Needs (10/23/2023)   PRAPARE - Administrator, Civil Service (Medical): No    Lack of Transportation  (Non-Medical): No  Physical Activity: Sufficiently Active (10/23/2023)   Exercise Vital Sign    Days of Exercise per Week: 3 days    Minutes of Exercise per Session: 120 min  Stress: No Stress Concern Present (10/23/2023)   [employer] of Occupational Health - Occupational Stress Questionnaire    Feeling of Stress : Not at all  Social Connections: Unknown (12/25/2022)   Received from [employer]   Social Connections    Social Connections: Not on file    Social Connections: Not on file  Intimate Partner Violence: Not At Risk (10/23/2023)   Humiliation, Afraid, Rape, and Kick questionnaire    Fear of Current or Ex-Partner: No    Emotionally Abused: No    Physically Abused: No    Sexually Abused: No    Outpatient Medications Prior to Visit  Medication Sig Dispense Refill   aspirin EC 81 MG tablet Take 81 mg by mouth daily. Swallow whole.     carvedilol  (COREG ) 3.125 MG tablet TAKE ONE TABLET BY MOUTH TWICE DAILY 180 tablet 0   cetirizine (ZYRTEC) 10 MG tablet Take 10 mg by mouth daily.     Cholecalciferol (VITAMIN D ) 50 MCG (2000 UT) tablet Take 2,000 Units by mouth daily.     Cyanocobalamin (VITAMIN B-12 PO) Take 1 tablet by mouth daily.     dextromethorphan-guaiFENesin (ROBITUSSIN-DM) 10-100 MG/5ML liquid Take 5 mLs by mouth 1 day or 1 dose. PRN     ezetimibe  (ZETIA ) 10 MG tablet Take 1 tablet (10 mg total) by mouth daily. 90 tablet 3   FIBER PO Take 2 tablets by mouth daily.     fluticasone  (FLONASE ) 50 MCG/ACT nasal spray SPRAY 2 SPRAYS INTO EACH NOSTRIL EVERY DAY 48 mL 2   GARLIC PO Take 1 capsule by mouth daily.     Ginkgo Biloba (GNP GINGKO BILOBA EXTRACT PO) Take 1 capsule by mouth daily.     nitroGLYCERIN  (NITROSTAT ) 0.4 MG SL tablet Place 1 tablet (0.4 mg total) under the tongue every 5 (five) minutes as needed for chest pain. 10 tablet 2   NON FORMULARY Take 1 tablet by mouth daily. Coenzymated B-3     NON FORMULARY Take 1 tablet by mouth daily. Citrust Bermamot extract      sildenafil  (VIAGRA ) 50 MG tablet Take 1 tablet (50 mg total) by mouth daily as needed for erectile dysfunction. 30 tablet 2   Thiamine HCl (VITAMIN B-1 PO) Take 1 tablet by mouth daily.     atorvastatin  (LIPITOR) 80 MG tablet Take 1 tablet (80 mg total) by mouth at bedtime. 90 tablet 3   azithromycin  (ZITHROMAX ) 250 MG tablet Take first 2 tablets together, then  1 every day until finished. 6 tablet 0   FARXIGA  10 MG TABS tablet TAKE ONE TABLET BY MOUTH EVERY DAY 30 tablet 1   lisinopril  (ZESTRIL ) 20 MG tablet Take 1 tablet (20 mg total) by mouth daily. 90 tablet 3   metFORMIN  (GLUCOPHAGE -XR) 500 MG 24 hr tablet TAKE ONE TABLET BY MOUTH EVERY DAY WITH BREAKFAST 90 tablet 0   No facility-administered medications prior to visit.    Allergies  Allergen Reactions   Amoxicillin-Pot Clavulanate     severe diahrea-c.diff.    ROS Review of Systems  Constitutional:  Negative for chills and fever.  HENT:  Negative for congestion, postnasal drip and sore throat.   Eyes:  Negative for pain and discharge.  Respiratory:  Negative for cough and shortness of breath.   Cardiovascular:  Negative for chest pain and palpitations.  Gastrointestinal:  Negative for constipation, diarrhea, nausea and vomiting.  Endocrine: Negative for polydipsia and polyuria.  Genitourinary:  Negative for dysuria and hematuria.  Musculoskeletal:  Negative for neck pain and neck stiffness.  Skin:  Negative for rash.  Neurological:  Negative for dizziness, weakness, numbness and headaches.  Psychiatric/Behavioral:  Negative for agitation and behavioral problems.       Objective:    Physical Exam Vitals reviewed.  Constitutional:      General: He is not in acute distress.    Appearance: He is not diaphoretic.  HENT:     Head: Normocephalic and atraumatic.     Nose: No congestion.     Mouth/Throat:     Mouth: Mucous membranes are moist.  Eyes:     General: No scleral icterus.    Extraocular Movements: Extraocular  movements intact.  Cardiovascular:     Rate and Rhythm: Normal rate and regular rhythm.     Heart sounds: Normal heart sounds. No murmur heard. Pulmonary:     Breath sounds: Normal breath sounds. No wheezing or rales.  Musculoskeletal:     Left hand: Tenderness (Third digit PIP joint) present. No swelling. Normal range of motion. Normal strength.     Cervical back: Neck supple. No tenderness.     Right lower leg: No edema.     Left lower leg: No edema.  Skin:    General: Skin is warm.     Findings: No rash.  Neurological:     General: No focal deficit present.     Mental Status: He is alert and oriented to person, place, and time.     Sensory: No sensory deficit.     Motor: No weakness.  Psychiatric:        Mood and Affect: Mood normal.        Behavior: Behavior normal.     BP 103/61   Pulse 62   Ht 5' 10 (1.778 m)   Wt 189 lb 6.4 oz (85.9 kg)   SpO2 94%   BMI 27.18 kg/m  Wt Readings from Last 3 Encounters:  04/29/24 185 lb 6.4 oz (84.1 kg)  12/27/23 189 lb 6.4 oz (85.9 kg)  10/23/23 175 lb (79.4 kg)    Lab Results  Component Value Date   TSH 3.580 04/24/2024   Lab Results  Component Value Date   WBC 4.5 04/24/2024   HGB 14.1 04/24/2024   HCT 43.3 04/24/2024   MCV 99 (H) 04/24/2024   PLT 182 04/24/2024   Lab Results  Component Value Date   NA 139 04/24/2024   K 4.8 04/24/2024   CO2 21 04/24/2024  GLUCOSE 139 (H) 04/24/2024   BUN 29 (H) 04/24/2024   CREATININE 1.17 04/24/2024   BILITOT 0.5 04/24/2024   ALKPHOS 68 04/24/2024   AST 22 04/24/2024   ALT 31 04/24/2024   PROT 7.1 04/24/2024   ALBUMIN 4.7 04/24/2024   CALCIUM  9.1 04/24/2024   ANIONGAP 10 02/22/2024   EGFR 67 04/24/2024   Lab Results  Component Value Date   CHOL 117 04/24/2024   Lab Results  Component Value Date   HDL 32 (L) 04/24/2024   Lab Results  Component Value Date   LDLCALC 63 04/24/2024   Lab Results  Component Value Date   TRIG 119 04/24/2024   Lab Results   Component Value Date   CHOLHDL 3.7 04/24/2024   Lab Results  Component Value Date   HGBA1C 7.2 (H) 04/24/2024      Assessment & Plan:   Problem List Items Addressed This Visit       Cardiovascular and Mediastinum   CAD (coronary artery disease) with stable angina (Chronic)   S/p stent placement On aspirin and statin currently On Coreg  Has Nitroglycerin  PRN for chest pain Followed by cardiology      Essential hypertension, benign (Chronic)   BP Readings from Last 1 Encounters:  12/27/23 103/61   Well-controlled with lisinopril  and Coreg  Counseled for compliance with the medications Advised DASH diet and moderate exercise/walking, at least 150 mins/week      Relevant Orders   TSH (Completed)   CMP14+EGFR (Completed)   CBC with Differential/Platelet (Completed)     Endocrine   Type 2 diabetes mellitus with hyperglycemia (HCC) - Primary   Lab Results  Component Value Date   HGBA1C 9.1 (H) 12/17/2023   Uncontrolled due to diet noncompliance Associated with CAD, HTN and HLD On Farxiga  10 mg daily and Metformin  1000 mg once daily Discussed about potential benefits and side effects of GLP-1 agonist, he would be a good candidate considering his cardiac history, but he wants to think about it -added glipizide  5 mg twice daily for now due to hyperglycemia, advised to follow diabetic diet On statin and ACEi F/u CMP and lipid panel Diabetic eye exam: Advised to follow up with Ophthalmology for diabetic eye exam      Relevant Medications   metFORMIN  (GLUCOPHAGE -XR) 500 MG 24 hr tablet   glipiZIDE  (GLUCOTROL ) 5 MG tablet   Other Relevant Orders   Hemoglobin A1c (Completed)   CMP14+EGFR (Completed)     Other   Mixed hyperlipidemia (Chronic)   On Lipitor 80 mg QD Checked lipid profile - LDL 88, not at goal, but improved compared to prior Added Zetia  10 mg once daily in the last visit      Relevant Orders   Lipid panel (Completed)   Prostate cancer screening    Ordered PSA after discussing its limitations for prostate cancer screening, including false positive results leading to additional investigations.      Relevant Orders   PSA (Completed)   Left hand pain   Since 12/13/23 Unclear etiology, but could be related to OA of the hand Can try Voltaren gel ROM and strength of hand intact currently      Other Visit Diagnoses       Vitamin D  deficiency       Relevant Orders   VITAMIN D  25 Hydroxy (Vit-D Deficiency, Fractures) (Completed)        Meds ordered this encounter  Medications   metFORMIN  (GLUCOPHAGE -XR) 500 MG 24 hr tablet    Sig: Take  2 tablets (1,000 mg total) by mouth daily with breakfast.    Dispense:  90 tablet    Refill:  3   glipiZIDE  (GLUCOTROL ) 5 MG tablet    Sig: Take 1 tablet (5 mg total) by mouth 2 (two) times daily before a meal.    Dispense:  60 tablet    Refill:  3    Follow-up: Return in about 4 months (around 04/27/2024) for Annual physical.    Suzzane MARLA Blanch, MD

## 2023-12-27 NOTE — Assessment & Plan Note (Addendum)
 Lab Results  Component Value Date   HGBA1C 9.1 (H) 12/17/2023   Uncontrolled due to diet noncompliance Associated with CAD, HTN and HLD On Farxiga  10 mg daily and Metformin  1000 mg once daily Discussed about potential benefits and side effects of GLP-1 agonist, he would be a good candidate considering his cardiac history, but he wants to think about it -added glipizide  5 mg twice daily for now due to hyperglycemia, advised to follow diabetic diet On statin and ACEi F/u CMP and lipid panel Diabetic eye exam: Advised to follow up with Ophthalmology for diabetic eye exam

## 2023-12-27 NOTE — Assessment & Plan Note (Signed)
S/p stent placement On aspirin and statin currently On Coreg Has Nitroglycerin PRN for chest pain Followed by cardiology

## 2023-12-27 NOTE — Assessment & Plan Note (Signed)
 Ordered PSA after discussing its limitations for prostate cancer screening, including false positive results leading to additional investigations.

## 2024-01-15 ENCOUNTER — Telehealth: Admitting: Physician Assistant

## 2024-01-15 DIAGNOSIS — B9689 Other specified bacterial agents as the cause of diseases classified elsewhere: Secondary | ICD-10-CM | POA: Diagnosis not present

## 2024-01-15 DIAGNOSIS — J208 Acute bronchitis due to other specified organisms: Secondary | ICD-10-CM

## 2024-01-15 MED ORDER — DOXYCYCLINE HYCLATE 100 MG PO TABS: 100.0000 mg | ORAL_TABLET | Freq: Two times a day (BID) | ORAL | 0 refills | Status: DC

## 2024-01-15 MED ORDER — BENZONATATE 100 MG PO CAPS: 100.0000 mg | ORAL_CAPSULE | Freq: Three times a day (TID) | ORAL | 0 refills | Status: DC | PRN

## 2024-01-15 NOTE — Progress Notes (Signed)

## 2024-01-15 NOTE — Progress Notes (Signed)
 I have spent 5 minutes in review of e-visit questionnaire, review and updating patient chart, medical decision making and response to patient.   Piedad Climes, PA-C

## 2024-01-18 ENCOUNTER — Ambulatory Visit
Admission: EM | Admit: 2024-01-18 | Discharge: 2024-01-18 | Disposition: A | Discharge status: Home / Self Care | Attending: Nurse Practitioner | Admitting: Nurse Practitioner

## 2024-01-18 ENCOUNTER — Encounter: Payer: Self-pay | Admitting: Emergency Medicine

## 2024-01-18 DIAGNOSIS — J209 Acute bronchitis, unspecified: Secondary | ICD-10-CM

## 2024-01-18 DIAGNOSIS — R059 Cough, unspecified: Secondary | ICD-10-CM

## 2024-01-18 MED ORDER — ALBUTEROL SULFATE HFA 108 (90 BASE) MCG/ACT IN AERS: 2.0000 | INHALATION_SPRAY | Freq: Four times a day (QID) | RESPIRATORY_TRACT | 0 refills | Status: AC | PRN

## 2024-01-18 MED ORDER — PROMETHAZINE-DM 6.25-15 MG/5ML PO SYRP: 5.0000 mL | ORAL_SOLUTION | Freq: Four times a day (QID) | ORAL | 0 refills | Status: DC | PRN

## 2024-01-18 NOTE — ED Provider Notes (Signed)
 [facility]    CSN: [id number] Arrival date & time: 01/18/24  0809      History   Chief Complaint No chief complaint on file.   HPI Jesse Jordan is a 73 y.o. male.   The history is provided by the patient.   Patient presents for complaints of cough and chest congestion has been present for the past 2 weeks.  Patient completed a telehealth visit on 4/23 and was prescribed benzonatate  and doxycycline .  Patient states that he continues to have the persistent cough, states cough is worse at night.  He denies new fevers, chills, headache, ear pain, nasal congestion, runny nose, wheezing, difficulty breathing, chest pain, abdominal pain, nausea, vomiting, diarrhea, or rash.  Patient states that he has been lying flat when he has been sleeping, states that he has to get up in the middle of the night to get in his recliner to get rest.  Patient with underlying history of diabetes.  Most recent A1c was 9.1 in March. Past Medical History:  Diagnosis Date   Allergy 1975   Mold, pollen - shots then but now treated by over the counter meds   Anemia    Cataract 2018   Removed   Coronary artery disease    Diabetes mellitus without complication (HCC) 1995   Type 2 - medication   Encounter for general adult medical examination with abnormal findings 04/19/2023   GERD (gastroesophageal reflux disease) 1985   Treated by medication   Hypertension 2015   Treated by medication   Localized osteoporosis without current pathological fracture 01/18/2022   Mixed hyperlipidemia 02/06/2008   Myocardial infarction (HCC) 11/03/2019   Vestibular neuritis, bilateral     Patient Active Problem List   Diagnosis Date Noted   Left hand pain 12/27/2023   Encounter for general adult medical examination with abnormal findings 04/19/2023   Prostate cancer screening 04/19/2023   Acute non-recurrent frontal sinusitis 04/19/2023   Hypogonadism, male 01/01/2023   Localized osteoporosis without current  pathological fracture 01/18/2022   Vestibular neuritis 08/16/2021   Type 2 diabetes mellitus with hyperglycemia (HCC) 08/16/2021   Erectile dysfunction 08/16/2021   Hyperparathyroidism, primary (HCC) 01/13/2021   CAD (coronary artery disease) with stable angina 11/06/2019   Hypotestosteronism 04/26/2015   Essential hypertension, benign 10/15/2014   Mixed hyperlipidemia 02/06/2008   Gastroesophageal reflux disease 07/09/2005    Past Surgical History:  Procedure Laterality Date   CARDIAC CATHETERIZATION  11/04/2019   PCI  to LCX   EYE SURGERY  2018   Cataracts removed   PARATHYROIDECTOMY N/A 12/10/2022   Procedure: NECK EXPLORATION WITH PARATHYROIDECTOMY;  Surgeon: Oralee Billow, MD;  Location: [facility];  Service: General;  Laterality: N/A;  2ND SCRUB PERSON PUJA       Home Medications    Prior to Admission medications   Medication Sig Start Date End Date Taking? Authorizing Provider  aspirin EC 81 MG tablet Take 81 mg by mouth daily. Swallow whole.    [provider]  atorvastatin  (LIPITOR) 80 MG tablet Take 1 tablet (80 mg total) by mouth at bedtime. 04/18/23   Meldon Sport, MD  benzonatate  (TESSALON ) 100 MG capsule Take 1 capsule (100 mg total) by mouth 3 (three) times daily as needed for cough. 01/15/24   Farris Hong, PA-C  carvedilol  (COREG ) 3.125 MG tablet TAKE ONE TABLET BY MOUTH TWICE DAILY 12/19/23   Patel, Rutwik K, MD  cetirizine (ZYRTEC) 10 MG tablet Take 10 mg by mouth daily. 08/14/05  [provider]  Cholecalciferol (VITAMIN D ) 50 MCG (2000 UT) tablet Take 2,000 Units by mouth daily.    [provider]  Cyanocobalamin (VITAMIN B-12 PO) Take 1 tablet by mouth daily.    [provider]  dextromethorphan-guaiFENesin (ROBITUSSIN-DM) 10-100 MG/5ML liquid Take 5 mLs by mouth 1 day or 1 dose. PRN    [provider]  doxycycline  (VIBRA -TABS) 100 MG tablet Take 1 tablet (100 mg total) by mouth 2 (two) times daily. 01/15/24    Farris Hong, PA-C  ezetimibe  (ZETIA ) 10 MG tablet Take 1 tablet (10 mg total) by mouth daily. 08/27/23   Meldon Sport, MD  FARXIGA  10 MG TABS tablet TAKE ONE TABLET BY MOUTH EVERY DAY 12/16/23   Patel, Rutwik K, MD  FIBER PO Take 2 tablets by mouth daily.    [provider]  fluticasone  (FLONASE ) 50 MCG/ACT nasal spray SPRAY 2 SPRAYS INTO EACH NOSTRIL EVERY DAY 11/12/22   Patel, Rutwik K, MD  GARLIC PO Take 1 capsule by mouth daily.    [provider]  Ginkgo Biloba (GNP GINGKO BILOBA EXTRACT PO) Take 1 capsule by mouth daily.    [provider]  glipiZIDE  (GLUCOTROL ) 5 MG tablet Take 1 tablet (5 mg total) by mouth 2 (two) times daily before a meal. 12/27/23   Meldon Sport, MD  lisinopril  (ZESTRIL ) 20 MG tablet Take 1 tablet (20 mg total) by mouth daily. 04/18/23   Meldon Sport, MD  metFORMIN  (GLUCOPHAGE -XR) 500 MG 24 hr tablet Take 2 tablets (1,000 mg total) by mouth daily with breakfast. 12/27/23   Meldon Sport, MD  nitroGLYCERIN  (NITROSTAT ) 0.4 MG SL tablet Place 1 tablet (0.4 mg total) under the tongue every 5 (five) minutes as needed for chest pain. 12/14/21   Meldon Sport, MD  NON FORMULARY Take 1 tablet by mouth daily. Coenzymated B-3    [provider]  NON FORMULARY Take 1 tablet by mouth daily. Citrust Bermamot extract    [provider]  sildenafil  (VIAGRA ) 50 MG tablet Take 1 tablet (50 mg total) by mouth daily as needed for erectile dysfunction. 12/14/21   Patel, Rutwik K, MD  Thiamine HCl (VITAMIN B-1 PO) Take 1 tablet by mouth daily.    [provider]    Family History Family History  Problem Relation Age of Onset   Hypertension Mother    Dementia Mother    Hyperlipidemia Mother    Cancer Father    Heart disease Father    Stroke Brother    Heart disease Brother     Social History Social History   Tobacco Use   Smoking status: Never   Smokeless tobacco: Never   Tobacco comments:    Never  Vaping Use    Vaping status: Never Used  Substance Use Topics   Alcohol use: Not Currently    Comment: Many years ago   Drug use: Not Currently    Types: Marijuana    Comment: Many years ago - 1973     Allergies   Amoxicillin-pot clavulanate   Review of Systems Review of Systems Per HPI  Physical Exam Triage Vital Signs ED Triage Vitals  Encounter Vitals Group     BP 01/18/24 0813 134/76     Systolic BP Percentile --      Diastolic BP Percentile --      Pulse Rate 01/18/24 0813 65     Resp 01/18/24 0813 18     Temp 01/18/24 0813  97.7 F (36.5 C)     Temp Source 01/18/24 0813 Oral     SpO2 01/18/24 0813 92 %     Weight --      Height --      Head Circumference --      Peak Flow --      Pain Score 01/18/24 0815 0     Pain Loc --      Pain Education --      Exclude from Growth Chart --    No data found.  Updated Vital Signs BP 134/76 (BP Location: Right Arm)   Pulse 65   Temp 97.7 F (36.5 C) (Oral)   Resp 18   SpO2 92%   Visual Acuity Right Eye Distance:   Left Eye Distance:   Bilateral Distance:    Right Eye Near:   Left Eye Near:    Bilateral Near:     Physical Exam Vitals and nursing note reviewed.  Constitutional:      General: He is not in acute distress.    Appearance: Normal appearance.  HENT:     Head: Normocephalic.     Right Ear: Tympanic membrane, ear canal and external ear normal.     Left Ear: Tympanic membrane, ear canal and external ear normal.     Nose: Nose normal.     Mouth/Throat:     Mouth: Mucous membranes are moist.  Eyes:     Extraocular Movements: Extraocular movements intact.     Pupils: Pupils are equal, round, and reactive to light.  Cardiovascular:     Rate and Rhythm: Normal rate and regular rhythm.     Pulses: Normal pulses.     Heart sounds: Normal heart sounds.  Pulmonary:     Effort: Pulmonary effort is normal. No respiratory distress.     Breath sounds: Normal breath sounds. No stridor. No wheezing, rhonchi or rales.   Abdominal:     General: Bowel sounds are normal.     Palpations: Abdomen is soft.     Tenderness: There is no abdominal tenderness.  Musculoskeletal:     Cervical back: Normal range of motion.  Lymphadenopathy:     Cervical: No cervical adenopathy.  Skin:    General: Skin is warm and dry.  Neurological:     General: No focal deficit present.     Mental Status: He is alert and oriented to person, place, and time.  Psychiatric:        Mood and Affect: Mood normal.        Behavior: Behavior normal.      UC Treatments / Results  Labs (all labs ordered are listed, but only abnormal results are displayed) Labs Reviewed - No data to display  EKG   Radiology No results found.  Procedures Procedures (including critical care time)  Medications Ordered in UC Medications - No data to display  Initial Impression / Assessment and Plan / UC Course  I have reviewed the triage vital signs and the nursing notes.  Pertinent labs & imaging results that were available during my care of the patient were reviewed by me and considered in my medical decision making (see chart for details).  On exam, lung sounds are clear throughout, room air sats at 92%.  Patient is moving air well.  No indication for imaging at this time.  He has no symptoms of a URI at this time.  Discussed with patient that treatment was appropriate.  Advised patient that cough can linger  from days to weeks after an infection.  Advised patient that infection could also be viral which may be why he feels the antibiotics are not helping.  Will treat patient for bronchitis with Promethazine DM for cough at nighttime, and an albuterol inhaler as needed for persistent cough.  Will avoid use of steroids given patient's most recent A1c was 9.1.  Supportive care recommendations were provided and discussed with the patient to include fluids, rest, over-the-counter analgesics, use of a humidifier during nighttime, and sleeping elevated.   Discussed indications with patient regarding follow-up.  Patient was in agreement with this plan of care and verbalizes understanding.  All questions were answered.  Patient stable for discharge.  Final Clinical Impressions(s) / UC Diagnoses   Final diagnoses:  None   Discharge Instructions   None    ED Prescriptions   None    PDMP not reviewed this encounter.   Hardy Lia, NP 01/18/24 [phone]

## 2024-01-18 NOTE — ED Triage Notes (Signed)
 Cough and chest congestion x 2 weeks. Did an e-visit beginning of week and was given benzonatate  and doxycycline .  States he doesn't feel any better.  States cough is productive at times.  Has been using sudafed

## 2024-01-18 NOTE — Discharge Instructions (Signed)
 Take medication as prescribed.  Continue the medications you are currently prescribed. Increase fluids and allow for plenty of rest. May take over-the-counter Tylenol  as needed for pain, fever, or general discomfort. Recommend use of a humidifier in your bedroom at nighttime during sleep and sleeping elevated on pillows while cough symptoms persist. As discussed, your cough may last from days to weeks.  If you are generally feeling well and you continue to have a persistent cough, continue with use of over-the-counter cough medications, fluids, and cough drops.  Seek care if you develop fever, chills, wheezing, shortness of breath, difficulty breathing, or other concerns. Follow-up as needed.

## 2024-01-28 ENCOUNTER — Ambulatory Visit
Admission: EM | Admit: 2024-01-28 | Discharge: 2024-01-28 | Disposition: A | Discharge status: Home / Self Care | Attending: Nurse Practitioner | Admitting: Nurse Practitioner

## 2024-01-28 ENCOUNTER — Encounter: Payer: Self-pay | Admitting: Emergency Medicine

## 2024-01-28 DIAGNOSIS — H1013 Acute atopic conjunctivitis, bilateral: Secondary | ICD-10-CM

## 2024-01-28 MED ORDER — PATADAY 0.7 % OP SOLN: 1.0000 [drp] | Freq: Two times a day (BID) | OPHTHALMIC | 0 refills | Status: AC

## 2024-01-28 NOTE — ED Triage Notes (Signed)
 Left eye redness and drainage x 3 days.  States right eye is red and draining now.  States vision is blurry in right eye

## 2024-01-28 NOTE — ED Provider Notes (Addendum)
 [facility]    CSN: [id number] Arrival date & time: 01/28/24  1610      History   Chief Complaint No chief complaint on file.   HPI Jesse Jordan is a 73 y.o. male.   The history is provided by the patient.   Patient presents with a 3-day history of left eye redness, drainage, and irritation.  He states the right eye started with the same or similar symptoms today.  States that his vision is blurry in the right eye due to the drainage.  Patient states that the drainage has been clear, like tearing."  States that when he woke up this morning, his eye was matted shut.  Denies eye swelling, loss of vision, change in vision, nasal congestion, runny nose, or cough.  Patient reports that he does have underlying history of seasonal allergies.  States that he has been taking Zyrtec daily.  Patient wears eyeglasses.  Past Medical History:  Diagnosis Date   Allergy 1975   Mold, pollen - shots then but now treated by over the counter meds   Anemia    Cataract 2018   Removed   Coronary artery disease    Diabetes mellitus without complication (HCC) 1995   Type 2 - medication   Encounter for general adult medical examination with abnormal findings 04/19/2023   GERD (gastroesophageal reflux disease) 1985   Treated by medication   Hypertension 2015   Treated by medication   Localized osteoporosis without current pathological fracture 01/18/2022   Mixed hyperlipidemia 02/06/2008   Myocardial infarction (HCC) 11/03/2019   Vestibular neuritis, bilateral     Patient Active Problem List   Diagnosis Date Noted   Left hand pain 12/27/2023   Encounter for general adult medical examination with abnormal findings 04/19/2023   Prostate cancer screening 04/19/2023   Acute non-recurrent frontal sinusitis 04/19/2023   Hypogonadism, male 01/01/2023   Localized osteoporosis without current pathological fracture 01/18/2022   Vestibular neuritis 08/16/2021   Type 2 diabetes mellitus with  hyperglycemia (HCC) 08/16/2021   Erectile dysfunction 08/16/2021   Hyperparathyroidism, primary (HCC) 01/13/2021   CAD (coronary artery disease) with stable angina 11/06/2019   Hypotestosteronism 04/26/2015   Essential hypertension, benign 10/15/2014   Mixed hyperlipidemia 02/06/2008   Gastroesophageal reflux disease 07/09/2005    Past Surgical History:  Procedure Laterality Date   CARDIAC CATHETERIZATION  11/04/2019   PCI  to LCX   EYE SURGERY  2018   Cataracts removed   PARATHYROIDECTOMY N/A 12/10/2022   Procedure: NECK EXPLORATION WITH PARATHYROIDECTOMY;  Surgeon: Oralee Billow, MD;  Location: [facility];  Service: General;  Laterality: N/A;  2ND SCRUB PERSON PUJA       Home Medications    Prior to Admission medications   Medication Sig Start Date End Date Taking? Authorizing Provider  albuterol  (VENTOLIN  HFA) 108 (90 Base) MCG/ACT inhaler Inhale 2 puffs into the lungs every 6 (six) hours as needed. 01/18/24   Leath-Warren, Belen Bowers, NP  aspirin EC 81 MG tablet Take 81 mg by mouth daily. Swallow whole.    [provider]  atorvastatin  (LIPITOR) 80 MG tablet Take 1 tablet (80 mg total) by mouth at bedtime. 04/18/23   Meldon Sport, MD  benzonatate  (TESSALON ) 100 MG capsule Take 1 capsule (100 mg total) by mouth 3 (three) times daily as needed for cough. 01/15/24   Farris Hong, PA-C  carvedilol  (COREG ) 3.125 MG tablet TAKE ONE TABLET BY MOUTH TWICE DAILY 12/19/23   Meldon Sport, MD  cetirizine (ZYRTEC) 10 MG tablet Take 10 mg by mouth daily. 08/14/05   [provider]  Cholecalciferol (VITAMIN D ) 50 MCG (2000 UT) tablet Take 2,000 Units by mouth daily.    [provider]  Cyanocobalamin (VITAMIN B-12 PO) Take 1 tablet by mouth daily.    [provider]  dextromethorphan-guaiFENesin (ROBITUSSIN-DM) 10-100 MG/5ML liquid Take 5 mLs by mouth 1 day or 1 dose. PRN    [provider]  ezetimibe  (ZETIA ) 10 MG tablet Take 1 tablet (10 mg  total) by mouth daily. 08/27/23   Meldon Sport, MD  FARXIGA  10 MG TABS tablet TAKE ONE TABLET BY MOUTH EVERY DAY 12/16/23   Patel, Rutwik K, MD  FIBER PO Take 2 tablets by mouth daily.    [provider]  fluticasone  (FLONASE ) 50 MCG/ACT nasal spray SPRAY 2 SPRAYS INTO EACH NOSTRIL EVERY DAY 11/12/22   Patel, Rutwik K, MD  GARLIC PO Take 1 capsule by mouth daily.    [provider]  Ginkgo Biloba (GNP GINGKO BILOBA EXTRACT PO) Take 1 capsule by mouth daily.    [provider]  glipiZIDE  (GLUCOTROL ) 5 MG tablet Take 1 tablet (5 mg total) by mouth 2 (two) times daily before a meal. 12/27/23   Lydia Sams, Artie Bimler, MD  lisinopril  (ZESTRIL ) 20 MG tablet Take 1 tablet (20 mg total) by mouth daily. 04/18/23   Meldon Sport, MD  metFORMIN  (GLUCOPHAGE -XR) 500 MG 24 hr tablet Take 2 tablets (1,000 mg total) by mouth daily with breakfast. 12/27/23   Meldon Sport, MD  nitroGLYCERIN  (NITROSTAT ) 0.4 MG SL tablet Place 1 tablet (0.4 mg total) under the tongue every 5 (five) minutes as needed for chest pain. 12/14/21   Meldon Sport, MD  NON FORMULARY Take 1 tablet by mouth daily. Coenzymated B-3    [provider]  NON FORMULARY Take 1 tablet by mouth daily. Citrust Bermamot extract    [provider]  promethazine -dextromethorphan (PROMETHAZINE -DM) 6.25-15 MG/5ML syrup Take 5 mLs by mouth 4 (four) times daily as needed. 01/18/24   Leath-Warren, Belen Bowers, NP  sildenafil  (VIAGRA ) 50 MG tablet Take 1 tablet (50 mg total) by mouth daily as needed for erectile dysfunction. 12/14/21   Patel, Rutwik K, MD  Thiamine HCl (VITAMIN B-1 PO) Take 1 tablet by mouth daily.    [provider]    Family History Family History  Problem Relation Age of Onset   Hypertension Mother    Dementia Mother    Hyperlipidemia Mother    Cancer Father    Heart disease Father    Stroke Brother    Heart disease Brother     Social History Social History   Tobacco Use   Smoking  status: Never   Smokeless tobacco: Never   Tobacco comments:    Never  Vaping Use   Vaping status: Never Used  Substance Use Topics   Alcohol use: Not Currently    Comment: Many years ago   Drug use: Not Currently    Types: Marijuana    Comment: Many years ago - 1973     Allergies   Amoxicillin-pot clavulanate   Review of Systems Review of Systems Per HPI  Physical Exam Triage Vital Signs ED Triage Vitals  Encounter Vitals Group     BP 01/28/24 1627 122/71     Systolic BP Percentile --      Diastolic BP Percentile --      Pulse Rate 01/28/24 1627 66  Resp 01/28/24 1627 18     Temp 01/28/24 1627 98.1 F (36.7 C)     Temp Source 01/28/24 1627 Oral     SpO2 01/28/24 1627 95 %     Weight --      Height --      Head Circumference --      Peak Flow --      Pain Score 01/28/24 1629 2     Pain Loc --      Pain Education --      Exclude from Growth Chart --    No data found.  Updated Vital Signs BP 122/71 (BP Location: Right Arm)   Pulse 66   Temp 98.1 F (36.7 C) (Oral)   Resp 18   SpO2 95%   Visual Acuity Right Eye Distance: 20/20 Left Eye Distance: 20/20 Bilateral Distance: 20/20  Right Eye Near:   Left Eye Near:    Bilateral Near:     Physical Exam Vitals and nursing note reviewed.  Constitutional:      General: He is not in acute distress.    Appearance: Normal appearance.  HENT:     Head: Normocephalic.  Eyes:     General: Lids are normal. Vision grossly intact. Allergic shiner present. No visual field deficit or scleral icterus.       Right eye: Discharge present. No foreign body or hordeolum.        Left eye: No foreign body, discharge or hordeolum.     Extraocular Movements: Extraocular movements intact.     Right eye: Normal extraocular motion and no nystagmus.     Left eye: Normal extraocular motion and no nystagmus.     Conjunctiva/sclera:     Right eye: Right conjunctiva is not injected. No chemosis, exudate or hemorrhage.    Left  eye: Left conjunctiva is not injected. No chemosis, exudate or hemorrhage.    Pupils: Pupils are equal, round, and reactive to light.     Comments: Yellow drainage noted to the right medial canthus.  Mild conjunctival erythema noted to the right eye.  Abdominal:     Tenderness: There is no abdominal tenderness.  Musculoskeletal:     Cervical back: Normal range of motion.  Lymphadenopathy:     Cervical: No cervical adenopathy.  Skin:    General: Skin is warm and dry.  Neurological:     General: No focal deficit present.     Mental Status: He is alert and oriented to person, place, and time.  Psychiatric:        Mood and Affect: Mood normal.        Behavior: Behavior normal.      UC Treatments / Results  Labs (all labs ordered are listed, but only abnormal results are displayed) Labs Reviewed - No data to display  EKG   Radiology No results found.  Procedures Procedures (including critical care time)  Medications Ordered in UC Medications - No data to display  Initial Impression / Assessment and Plan / UC Course  I have reviewed the triage vital signs and the nursing notes.  Pertinent labs & imaging results that were available during my care of the patient were reviewed by me and considered in my medical decision making (see chart for details).  On exam the patient with yellow drainage noted to the right eye.  No eye swelling or mucopurulent drainage present.  He does have mild conjunctival erythema of the right eye.  Will treat for allergic rhinitis with  Pataday 0.7% eyedrop solution.  Supportive care recommendations were provided and discussed with the patient to include over-the-counter analgesics, cool compresses to the eyes to help with pain or swelling, and use of Visine or Clear Eyes to help keep the eyes moist and lubricated.  Discussed indications with patient regarding follow-up.  Patient was in agreement with this plan of care and verbalizes understanding.  All  questions were answered.  Patient stable for discharge.   Final Clinical Impressions(s) / UC Diagnoses   Final diagnoses:  None   Discharge Instructions   None    ED Prescriptions   None    PDMP not reviewed this encounter.   Hardy Lia, NP 01/28/24 1648    Hardy Lia, NP 01/28/24 1650

## 2024-01-28 NOTE — Discharge Instructions (Signed)
 Use eyedrops as prescribed.  Continue your current allergy medication. Cool compresses to the eyes to help with pain or swelling. Recommend over-the-counter Clear Eyes or Visine eyedrops to help keep the eyes moist and lubricated. Strict handwashing when applying medication.  Avoid rubbing or manipulating the eyes while symptoms persist. Follow-up if symptoms do not improve.

## 2024-02-02 ENCOUNTER — Other Ambulatory Visit: Payer: Self-pay | Admitting: Internal Medicine

## 2024-02-02 DIAGNOSIS — E119 Type 2 diabetes mellitus without complications: Secondary | ICD-10-CM

## 2024-02-22 ENCOUNTER — Emergency Department (HOSPITAL_COMMUNITY)
Admission: EM | Admit: 2024-02-22 | Discharge: 2024-02-22 | Disposition: A | Discharge status: Home / Self Care | Attending: Emergency Medicine | Admitting: Emergency Medicine

## 2024-02-22 ENCOUNTER — Emergency Department (HOSPITAL_COMMUNITY)

## 2024-02-22 DIAGNOSIS — S060XAA Concussion with loss of consciousness status unknown, initial encounter: Secondary | ICD-10-CM

## 2024-02-22 DIAGNOSIS — R410 Disorientation, unspecified: Secondary | ICD-10-CM | POA: Diagnosis not present

## 2024-02-22 DIAGNOSIS — S0990XA Unspecified injury of head, initial encounter: Secondary | ICD-10-CM | POA: Diagnosis not present

## 2024-02-22 DIAGNOSIS — I4510 Unspecified right bundle-branch block: Secondary | ICD-10-CM | POA: Diagnosis not present

## 2024-02-22 DIAGNOSIS — Z743 Need for continuous supervision: Secondary | ICD-10-CM | POA: Diagnosis not present

## 2024-02-22 DIAGNOSIS — S299XXA Unspecified injury of thorax, initial encounter: Secondary | ICD-10-CM | POA: Diagnosis not present

## 2024-02-22 DIAGNOSIS — S3993XA Unspecified injury of pelvis, initial encounter: Secondary | ICD-10-CM | POA: Diagnosis not present

## 2024-02-22 DIAGNOSIS — Z041 Encounter for examination and observation following transport accident: Secondary | ICD-10-CM | POA: Diagnosis not present

## 2024-02-22 DIAGNOSIS — I700 Atherosclerosis of aorta: Secondary | ICD-10-CM | POA: Insufficient documentation

## 2024-02-22 DIAGNOSIS — I10 Essential (primary) hypertension: Secondary | ICD-10-CM | POA: Diagnosis not present

## 2024-02-22 DIAGNOSIS — I2510 Atherosclerotic heart disease of native coronary artery without angina pectoris: Secondary | ICD-10-CM | POA: Diagnosis not present

## 2024-02-22 DIAGNOSIS — S060X0A Concussion without loss of consciousness, initial encounter: Secondary | ICD-10-CM | POA: Diagnosis not present

## 2024-02-22 DIAGNOSIS — S199XXA Unspecified injury of neck, initial encounter: Secondary | ICD-10-CM | POA: Diagnosis not present

## 2024-02-22 DIAGNOSIS — M160 Bilateral primary osteoarthritis of hip: Secondary | ICD-10-CM | POA: Diagnosis not present

## 2024-02-22 DIAGNOSIS — Y92410 Unspecified street and highway as the place of occurrence of the external cause: Secondary | ICD-10-CM | POA: Diagnosis not present

## 2024-02-22 DIAGNOSIS — S3991XA Unspecified injury of abdomen, initial encounter: Secondary | ICD-10-CM | POA: Diagnosis not present

## 2024-02-22 LAB — COMPREHENSIVE METABOLIC PANEL WITH GFR
ALT: 38 U/L (ref 0–44)
AST: 39 U/L (ref 15–41)
Albumin: 4.1 g/dL (ref 3.5–5.0)
Alkaline Phosphatase: 48 U/L (ref 38–126)
Anion gap: 10 (ref 5–15)
BUN: 35 mg/dL — ABNORMAL HIGH (ref 8–23)
CO2: 20 mmol/L — ABNORMAL LOW (ref 22–32)
Calcium: 8.7 mg/dL — ABNORMAL LOW (ref 8.9–10.3)
Chloride: 107 mmol/L (ref 98–111)
Creatinine, Ser: 1.3 mg/dL — ABNORMAL HIGH (ref 0.61–1.24)
GFR, Estimated: 59 mL/min — ABNORMAL LOW (ref >60)
Glucose, Bld: 141 mg/dL — ABNORMAL HIGH (ref 70–99)
Potassium: 4.8 mmol/L (ref 3.5–5.1)
Sodium: 137 mmol/L (ref 135–145)
Total Bilirubin: 1.5 mg/dL — ABNORMAL HIGH (ref 0.0–1.2)
Total Protein: 6.7 g/dL (ref 6.5–8.1)

## 2024-02-22 LAB — SAMPLE TO BLOOD BANK

## 2024-02-22 LAB — I-STAT CHEM 8, ED
BUN: 43 mg/dL — ABNORMAL HIGH (ref 8–23)
Calcium, Ion: 1.04 mmol/L — ABNORMAL LOW (ref 1.15–1.40)
Chloride: 109 mmol/L (ref 98–111)
Creatinine, Ser: 1.3 mg/dL — ABNORMAL HIGH (ref 0.61–1.24)
Glucose, Bld: 138 mg/dL — ABNORMAL HIGH (ref 70–99)
HCT: 40 % (ref 39.0–52.0)
Hemoglobin: 13.6 g/dL (ref 13.0–17.0)
Potassium: 4.8 mmol/L (ref 3.5–5.1)
Sodium: 139 mmol/L (ref 135–145)
TCO2: 20 mmol/L — ABNORMAL LOW (ref 22–32)

## 2024-02-22 LAB — CBC
HCT: 41.4 % (ref 39.0–52.0)
Hemoglobin: 13.9 g/dL (ref 13.0–17.0)
MCH: 32.8 pg (ref 26.0–34.0)
MCHC: 33.6 g/dL (ref 30.0–36.0)
MCV: 97.6 fL (ref 80.0–100.0)
Platelets: 201 10*3/uL (ref 150–400)
RBC: 4.24 MIL/uL (ref 4.22–5.81)
RDW: 13.4 % (ref 11.5–15.5)
WBC: 7 10*3/uL (ref 4.0–10.5)
nRBC: 0 % (ref 0.0–0.2)

## 2024-02-22 LAB — PROTIME-INR
INR: 0.9 (ref 0.8–1.2)
Prothrombin Time: 12.7 s (ref 11.4–15.2)

## 2024-02-22 LAB — I-STAT CG4 LACTIC ACID, ED: Lactic Acid, Venous: 1.7 mmol/L (ref 0.5–1.9)

## 2024-02-22 LAB — ETHANOL: Alcohol, Ethyl (B): <15 mg/dL (ref <15)

## 2024-02-22 MED ORDER — SODIUM CHLORIDE 0.9 % IV BOLUS
1000.0000 mL | Freq: Once | INTRAVENOUS | Status: AC
Start: 2024-02-22 — End: 2024-02-22
  Administered 2024-02-22: 1000 mL via INTRAVENOUS

## 2024-02-22 MED ORDER — IOHEXOL 350 MG/ML SOLN
75.0000 mL | Freq: Once | INTRAVENOUS | Status: AC | PRN
  Administered 2024-02-22: 75 mL via INTRAVENOUS

## 2024-02-22 MED ORDER — OXYCODONE HCL 5 MG PO TABS: 5.0000 mg | ORAL_TABLET | ORAL | 0 refills | Status: DC | PRN

## 2024-02-22 MED ORDER — ONDANSETRON HCL 4 MG PO TABS: 4.0000 mg | ORAL_TABLET | Freq: Four times a day (QID) | ORAL | 0 refills | Status: AC
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
## 2024-03-07 ENCOUNTER — Other Ambulatory Visit: Payer: Self-pay | Admitting: Internal Medicine

## 2024-03-07 DIAGNOSIS — E782 Mixed hyperlipidemia: Secondary | ICD-10-CM

## 2024-03-07 DIAGNOSIS — I10 Essential (primary) hypertension: Secondary | ICD-10-CM

## 2024-04-07 ENCOUNTER — Other Ambulatory Visit: Payer: Self-pay | Admitting: Internal Medicine

## 2024-04-07 DIAGNOSIS — E119 Type 2 diabetes mellitus without complications: Secondary | ICD-10-CM

## 2024-04-10 ENCOUNTER — Ambulatory Visit: Admitting: Internal Medicine

## 2024-04-24 DIAGNOSIS — E1165 Type 2 diabetes mellitus with hyperglycemia: Secondary | ICD-10-CM | POA: Diagnosis not present

## 2024-04-24 DIAGNOSIS — E782 Mixed hyperlipidemia: Secondary | ICD-10-CM | POA: Diagnosis not present

## 2024-04-24 DIAGNOSIS — E559 Vitamin D deficiency, unspecified: Secondary | ICD-10-CM | POA: Diagnosis not present

## 2024-04-24 DIAGNOSIS — I10 Essential (primary) hypertension: Secondary | ICD-10-CM | POA: Diagnosis not present

## 2024-04-24 DIAGNOSIS — Z125 Encounter for screening for malignant neoplasm of prostate: Secondary | ICD-10-CM | POA: Diagnosis not present

## 2024-04-25 LAB — CMP14+EGFR
ALT: 31 IU/L (ref 0–44)
AST: 22 IU/L (ref 0–40)
Albumin: 4.7 g/dL (ref 3.8–4.8)
Alkaline Phosphatase: 68 IU/L (ref 44–121)
BUN/Creatinine Ratio: 25 — ABNORMAL HIGH (ref 10–24)
BUN: 29 mg/dL — ABNORMAL HIGH (ref 8–27)
Bilirubin Total: 0.5 mg/dL (ref 0.0–1.2)
CO2: 21 mmol/L (ref 20–29)
Calcium: 9.1 mg/dL (ref 8.6–10.2)
Chloride: 104 mmol/L (ref 96–106)
Creatinine, Ser: 1.17 mg/dL (ref 0.76–1.27)
Globulin, Total: 2.4 g/dL (ref 1.5–4.5)
Glucose: 139 mg/dL — ABNORMAL HIGH (ref 70–99)
Potassium: 4.8 mmol/L (ref 3.5–5.2)
Sodium: 139 mmol/L (ref 134–144)
Total Protein: 7.1 g/dL (ref 6.0–8.5)
eGFR: 67 mL/min/1.73 (ref >59)

## 2024-04-25 LAB — CBC WITH DIFFERENTIAL/PLATELET
Basophils Absolute: 0 x10E3/uL (ref 0.0–0.2)
Basos: 1 %
EOS (ABSOLUTE): 0.2 x10E3/uL (ref 0.0–0.4)
Eos: 3 %
Hematocrit: 43.3 % (ref 37.5–51.0)
Hemoglobin: 14.1 g/dL (ref 13.0–17.7)
Immature Grans (Abs): 0 x10E3/uL (ref 0.0–0.1)
Immature Granulocytes: 0 %
Lymphocytes Absolute: 1.5 x10E3/uL (ref 0.7–3.1)
Lymphs: 32 %
MCH: 32.1 pg (ref 26.6–33.0)
MCHC: 32.6 g/dL (ref 31.5–35.7)
MCV: 99 fL — ABNORMAL HIGH (ref 79–97)
Monocytes Absolute: 0.4 x10E3/uL (ref 0.1–0.9)
Monocytes: 10 %
Neutrophils Absolute: 2.4 x10E3/uL (ref 1.4–7.0)
Neutrophils: 54 %
Platelets: 182 x10E3/uL (ref 150–450)
RBC: 4.39 x10E6/uL (ref 4.14–5.80)
RDW: 12.9 % (ref 11.6–15.4)
WBC: 4.5 x10E3/uL (ref 3.4–10.8)

## 2024-04-25 LAB — LIPID PANEL
Chol/HDL Ratio: 3.7 ratio (ref 0.0–5.0)
Cholesterol, Total: 117 mg/dL (ref 100–199)
HDL: 32 mg/dL — ABNORMAL LOW (ref >39)
LDL Chol Calc (NIH): 63 mg/dL (ref 0–99)
Triglycerides: 119 mg/dL (ref 0–149)
VLDL Cholesterol Cal: 22 mg/dL (ref 5–40)

## 2024-04-25 LAB — PSA: Prostate Specific Ag, Serum: 0.9 ng/mL (ref 0.0–4.0)

## 2024-04-25 LAB — VITAMIN D 25 HYDROXY (VIT D DEFICIENCY, FRACTURES): Vit D, 25-Hydroxy: 41.3 ng/mL (ref 30.0–100.0)

## 2024-04-25 LAB — TSH: TSH: 3.58 u[IU]/mL (ref 0.450–4.500)

## 2024-04-25 LAB — HEMOGLOBIN A1C
Est. average glucose Bld gHb Est-mCnc: 160 mg/dL
Hgb A1c MFr Bld: 7.2 % — ABNORMAL HIGH (ref 4.8–5.6)

## 2024-04-29 ENCOUNTER — Encounter: Payer: Self-pay | Admitting: Internal Medicine

## 2024-04-29 ENCOUNTER — Ambulatory Visit: Payer: Self-pay | Admitting: Internal Medicine

## 2024-04-29 VITALS — BP 124/68 | HR 64 | Ht 70.0 in | Wt 185.4 lb

## 2024-04-29 DIAGNOSIS — I25118 Atherosclerotic heart disease of native coronary artery with other forms of angina pectoris: Secondary | ICD-10-CM | POA: Diagnosis not present

## 2024-04-29 DIAGNOSIS — E782 Mixed hyperlipidemia: Secondary | ICD-10-CM | POA: Diagnosis not present

## 2024-04-29 DIAGNOSIS — E1165 Type 2 diabetes mellitus with hyperglycemia: Secondary | ICD-10-CM

## 2024-04-29 DIAGNOSIS — I10 Essential (primary) hypertension: Secondary | ICD-10-CM | POA: Diagnosis not present

## 2024-04-29 DIAGNOSIS — Z0001 Encounter for general adult medical examination with abnormal findings: Secondary | ICD-10-CM

## 2024-04-29 DIAGNOSIS — M722 Plantar fascial fibromatosis: Secondary | ICD-10-CM | POA: Diagnosis not present

## 2024-04-29 MED ORDER — DAPAGLIFLOZIN PROPANEDIOL 10 MG PO TABS: 10.0000 mg | ORAL_TABLET | Freq: Every day | ORAL | 11 refills | Status: AC

## 2024-04-29 MED ORDER — CARVEDILOL 3.125 MG PO TABS: 3.1250 mg | ORAL_TABLET | Freq: Two times a day (BID) | ORAL | 3 refills | Status: AC

## 2024-04-29 NOTE — Assessment & Plan Note (Signed)
 On Lipitor 80 mg QD and Zetia  10 mg QD Checked lipid profile - LDL 63, at goal, improved compared to prior

## 2024-04-29 NOTE — Assessment & Plan Note (Addendum)
 Lab Results  Component Value Date   HGBA1C 7.2 (H) 04/24/2024   Better controlled now Associated with CAD, HTN and HLD On Farxiga  10 mg QD, Metformin  1000 mg QD Had discussed about potential benefits and side effects of GLP-1 agonist, he would be a good candidate considering his cardiac history, but he wants to think about it - advised to follow diabetic diet On statin and ACEi F/u CMP and lipid panel Diabetic eye exam: Advised to follow up with Ophthalmology for diabetic eye exam

## 2024-04-29 NOTE — Assessment & Plan Note (Signed)
 Left heel pain likely due to plantar fasciitis Advised to apply ice, perform leg elevation and rest Continue using orthotic shoes for better support If persistent pain, will get x-ray of foot

## 2024-04-29 NOTE — Patient Instructions (Addendum)
 Please continue to take medications as prescribed.  Please continue to follow low carb diet and perform moderate exercise/walking at least 150 mins/week.

## 2024-04-29 NOTE — Assessment & Plan Note (Addendum)
 Physical exam as documented. Fasting blood tests reviewed. Advised to get Tdap vaccines at local pharmacy.

## 2024-04-29 NOTE — Progress Notes (Signed)
 Established Patient Office Visit  Subjective:  Patient ID: Jesse Jordan, male    DOB: 20-Apr-1951  Age: 73 y.o. MRN: [id number]  CC:  Chief Complaint  Patient presents with   Annual Exam    Annual exam/ 4 month f/u    Foot Pain    Pt reports sx of right foot pain ongoing for 3 months, has been using comfortable shoes to help with it.     HPI Jesse Jordan is a 73 y.o. male with past medical history of CAD s/p stent placement (2021), HTN, GERD, type II DM with HLD who presents for annual physical.  HTN: BP is well-controlled. Takes medications regularly. Patient denies headache, dizziness, chest pain, dyspnea or palpitations.   CAD s/p stent placement in 2021: He denies any chest pain currently. He is on aspirin and statin currently.   Type II DM with HLD: He is on Farxiga  10 mg QD and metformin  1000 mg QD currently.  He denies any polyuria or polydipsia. His last HbA1C is 7.2 now, improved from 9.1 in 03/25.  He has started following low-carb diet since the last visit. He has not taken Glipizide .  His blood glucose levels are around 120-150s now. he is also on Lipitor 80 mg.  Primary hyperparathyroidism: He had parathyroidectomy in 03/24.  He sees Dr. Lenis for it.  Denies any abdominal pain, nausea, vomiting, hematuria or mood disturbance currently.  He complains of left foot pain, around the heel area for the last 3 months.  Pain is intermittent, worse with prolonged standing and better with rest.  He has tried using shoe inserts and recently changed his shoes, which has improved his pain.   Past Medical History:  Diagnosis Date   Allergy 1975   Mold, pollen - shots then but now treated by over the counter meds   Anemia    Cataract 2018   Removed   Coronary artery disease    Diabetes mellitus without complication (HCC) 1995   Type 2 - medication   Encounter for general adult medical examination with abnormal findings 04/19/2023   GERD (gastroesophageal reflux disease) 1985    Treated by medication   Hypertension 2015   Treated by medication   Localized osteoporosis without current pathological fracture 01/18/2022   Mixed hyperlipidemia 02/06/2008   Myocardial infarction (HCC) 11/03/2019   Vestibular neuritis, bilateral     Past Surgical History:  Procedure Laterality Date   CARDIAC CATHETERIZATION  11/04/2019   PCI  to LCX   EYE SURGERY  2018   Cataracts removed   PARATHYROIDECTOMY N/A 12/10/2022   Procedure: NECK EXPLORATION WITH PARATHYROIDECTOMY;  Surgeon: Eletha Boas, MD;  Location: [facility];  Service: General;  Laterality: N/A;  2ND SCRUB PERSON PUJA    Family History  Problem Relation Age of Onset   Hypertension Mother    Dementia Mother    Hyperlipidemia Mother    Cancer Father    Heart disease Father    Stroke Brother    Heart disease Brother     Social History   Socioeconomic History   Marital status: Married    Spouse name: Not on file   Number of children: 2   Years of education: Not on file   Highest education level: Not on file  Occupational History   Occupation: Retired Presenter, broadcasting  Tobacco Use   Smoking status: Never   Smokeless tobacco: Never   Tobacco comments:    Never  Vaping Use   Vaping status: Never Used  Substance and Sexual Activity   Alcohol use: Not Currently    Comment: Many years ago   Drug use: Not Currently    Types: Marijuana    Comment: Many years ago - 1973   Sexual activity: Yes    Birth control/protection: Other-see comments, None    Comment: Active some but have ED.  Other Topics Concern   Not on file  Social History Narrative   Not on file   Social Drivers of Health   Financial Resource Strain: Low Risk  (10/23/2023)   Overall Financial Resource Strain (CARDIA)    Difficulty of Paying Living Expenses: Not hard at all  Food Insecurity: No Food Insecurity (10/23/2023)   Hunger Vital Sign    Worried About Running Out of Food in the Last Year: Never true    Ran Out of Food in the Last  Year: Never true  Transportation Needs: No Transportation Needs (10/23/2023)   PRAPARE - Administrator, Civil Service (Medical): No    Lack of Transportation (Non-Medical): No  Physical Activity: Sufficiently Active (10/23/2023)   Exercise Vital Sign    Days of Exercise per Week: 3 days    Minutes of Exercise per Session: 120 min  Stress: No Stress Concern Present (10/23/2023)   [employer] of Occupational Health - Occupational Stress Questionnaire    Feeling of Stress : Not at all  Social Connections: Unknown (12/25/2022)   Received from [employer]   Social Connections    Social Connections: Not on file    Social Connections: Not on file  Intimate Partner Violence: Not At Risk (10/23/2023)   Humiliation, Afraid, Rape, and Kick questionnaire    Fear of Current or Ex-Partner: No    Emotionally Abused: No    Physically Abused: No    Sexually Abused: No    Outpatient Medications Prior to Visit  Medication Sig Dispense Refill   albuterol  (VENTOLIN  HFA) 108 (90 Base) MCG/ACT inhaler Inhale 2 puffs into the lungs every 6 (six) hours as needed. 8 g 0   aspirin EC 81 MG tablet Take 81 mg by mouth daily. Swallow whole.     atorvastatin  (LIPITOR) 80 MG tablet TAKE ONE TABLET DAILY BY MOUTH AT BEDTIME 90 tablet 2   cetirizine (ZYRTEC) 10 MG tablet Take 10 mg by mouth daily.     Cholecalciferol (VITAMIN D ) 50 MCG (2000 UT) tablet Take 2,000 Units by mouth daily.     Cyanocobalamin (VITAMIN B-12 PO) Take 1 tablet by mouth daily.     ezetimibe  (ZETIA ) 10 MG tablet Take 1 tablet (10 mg total) by mouth daily. 90 tablet 3   FIBER PO Take 2 tablets by mouth daily.     fluticasone  (FLONASE ) 50 MCG/ACT nasal spray SPRAY 2 SPRAYS INTO EACH NOSTRIL EVERY DAY 48 mL 2   GARLIC PO Take 1 capsule by mouth daily.     Ginkgo Biloba (GNP GINGKO BILOBA EXTRACT PO) Take 1 capsule by mouth daily.     lisinopril  (ZESTRIL ) 20 MG tablet TAKE ONE TABLET BY MOUTH EVERY DAY 90 tablet 2   metFORMIN   (GLUCOPHAGE -XR) 500 MG 24 hr tablet Take 2 tablets (1,000 mg total) by mouth daily with breakfast. 90 tablet 3   nitroGLYCERIN  (NITROSTAT ) 0.4 MG SL tablet Place 1 tablet (0.4 mg total) under the tongue every 5 (five) minutes as needed for chest pain. 10 tablet 2   NON FORMULARY Take 1 tablet by mouth daily. Coenzymated B-3     NON FORMULARY Take  1 tablet by mouth daily. Citrust Bermamot extract     Olopatadine  HCl (PATADAY ) 0.7 % SOLN Apply 1 drop to eye 2 (two) times daily. 5 mL 0   ondansetron  (ZOFRAN ) 4 MG tablet Take 1 tablet (4 mg total) by mouth every 6 (six) hours. 20 tablet 0   sildenafil  (VIAGRA ) 50 MG tablet Take 1 tablet (50 mg total) by mouth daily as needed for erectile dysfunction. 30 tablet 2   Thiamine HCl (VITAMIN B-1 PO) Take 1 tablet by mouth daily.     benzonatate  (TESSALON ) 100 MG capsule Take 1 capsule (100 mg total) by mouth 3 (three) times daily as needed for cough. 30 capsule 0   carvedilol  (COREG ) 3.125 MG tablet TAKE ONE TABLET BY MOUTH TWICE DAILY 180 tablet 0   dextromethorphan-guaiFENesin (ROBITUSSIN-DM) 10-100 MG/5ML liquid Take 5 mLs by mouth 1 day or 1 dose. PRN     FARXIGA  10 MG TABS tablet TAKE ONE TABLET BY MOUTH EVERY DAY 30 tablet 1   glipiZIDE  (GLUCOTROL ) 5 MG tablet Take 1 tablet (5 mg total) by mouth 2 (two) times daily before a meal. 60 tablet 3   oxyCODONE  (ROXICODONE ) 5 MG immediate release tablet Take 1 tablet (5 mg total) by mouth every 4 (four) hours as needed for severe pain (pain score 7-10). 8 tablet 0   promethazine -dextromethorphan (PROMETHAZINE -DM) 6.25-15 MG/5ML syrup Take 5 mLs by mouth 4 (four) times daily as needed. 118 mL 0   No facility-administered medications prior to visit.    Allergies  Allergen Reactions   Amoxicillin-Pot Clavulanate     severe diahrea-c.diff.    ROS Review of Systems  Constitutional:  Negative for chills and fever.  HENT:  Negative for congestion, postnasal drip and sore throat.   Eyes:  Negative for pain  and discharge.  Respiratory:  Negative for cough and shortness of breath.   Cardiovascular:  Negative for chest pain and palpitations.  Gastrointestinal:  Negative for constipation, diarrhea, nausea and vomiting.  Endocrine: Negative for polydipsia and polyuria.  Genitourinary:  Negative for dysuria and hematuria.  Musculoskeletal:  Negative for neck pain and neck stiffness.       Left heel pain  Skin:  Negative for rash.  Neurological:  Negative for dizziness, weakness, numbness and headaches.  Psychiatric/Behavioral:  Negative for agitation and behavioral problems.       Objective:    Physical Exam Vitals reviewed.  Constitutional:      General: He is not in acute distress.    Appearance: He is not diaphoretic.  HENT:     Head: Normocephalic and atraumatic.     Nose: No congestion.     Mouth/Throat:     Mouth: Mucous membranes are moist.  Eyes:     General: No scleral icterus.    Extraocular Movements: Extraocular movements intact.  Cardiovascular:     Rate and Rhythm: Normal rate and regular rhythm.     Heart sounds: Normal heart sounds. No murmur heard. Pulmonary:     Breath sounds: Normal breath sounds. No wheezing or rales.  Abdominal:     Palpations: Abdomen is soft.     Tenderness: There is no abdominal tenderness.  Musculoskeletal:     Left hand: Tenderness (Third digit PIP joint) present. No swelling. Normal range of motion. Normal strength.     Cervical back: Neck supple. No tenderness.     Right lower leg: No edema.     Left lower leg: No edema.  Feet:  Comments: Left heel pain/tenderness Skin:    General: Skin is warm.     Findings: No rash.  Neurological:     General: No focal deficit present.     Mental Status: He is alert and oriented to person, place, and time.     Cranial Nerves: No cranial nerve deficit.     Sensory: No sensory deficit.     Motor: No weakness.  Psychiatric:        Mood and Affect: Mood normal.        Behavior: Behavior  normal.     BP 124/68   Pulse 64   Ht 5' 10 (1.778 m)   Wt 185 lb 6.4 oz (84.1 kg)   SpO2 94%   BMI 26.60 kg/m  Wt Readings from Last 3 Encounters:  04/29/24 185 lb 6.4 oz (84.1 kg)  12/27/23 189 lb 6.4 oz (85.9 kg)  10/23/23 175 lb (79.4 kg)    Lab Results  Component Value Date   TSH 3.580 04/24/2024   Lab Results  Component Value Date   WBC 4.5 04/24/2024   HGB 14.1 04/24/2024   HCT 43.3 04/24/2024   MCV 99 (H) 04/24/2024   PLT 182 04/24/2024   Lab Results  Component Value Date   NA 139 04/24/2024   K 4.8 04/24/2024   CO2 21 04/24/2024   GLUCOSE 139 (H) 04/24/2024   BUN 29 (H) 04/24/2024   CREATININE 1.17 04/24/2024   BILITOT 0.5 04/24/2024   ALKPHOS 68 04/24/2024   AST 22 04/24/2024   ALT 31 04/24/2024   PROT 7.1 04/24/2024   ALBUMIN 4.7 04/24/2024   CALCIUM  9.1 04/24/2024   ANIONGAP 10 02/22/2024   EGFR 67 04/24/2024   Lab Results  Component Value Date   CHOL 117 04/24/2024   Lab Results  Component Value Date   HDL 32 (L) 04/24/2024   Lab Results  Component Value Date   LDLCALC 63 04/24/2024   Lab Results  Component Value Date   TRIG 119 04/24/2024   Lab Results  Component Value Date   CHOLHDL 3.7 04/24/2024   Lab Results  Component Value Date   HGBA1C 7.2 (H) 04/24/2024      Assessment & Plan:   Problem List Items Addressed This Visit       Cardiovascular and Mediastinum   CAD (coronary artery disease) with stable angina (Chronic)   S/p stent placement On aspirin and statin currently On Coreg  Has Nitroglycerin  PRN for chest pain Followed by cardiology      Relevant Medications   carvedilol  (COREG ) 3.125 MG tablet   Essential hypertension, benign (Chronic)   BP Readings from Last 1 Encounters:  04/29/24 124/68   Well-controlled with lisinopril  and Coreg  Counseled for compliance with the medications Advised DASH diet and moderate exercise/walking, at least 150 mins/week      Relevant Medications   carvedilol   (COREG ) 3.125 MG tablet     Endocrine   Type 2 diabetes mellitus with hyperglycemia (HCC)   Lab Results  Component Value Date   HGBA1C 7.2 (H) 04/24/2024   Better controlled now Associated with CAD, HTN and HLD On Farxiga  10 mg QD, Metformin  1000 mg QD Had discussed about potential benefits and side effects of GLP-1 agonist, he would be a good candidate considering his cardiac history, but he wants to think about it - advised to follow diabetic diet On statin and ACEi F/u CMP and lipid panel Diabetic eye exam: Advised to follow up with Ophthalmology for diabetic  eye exam      Relevant Medications   dapagliflozin  propanediol (FARXIGA ) 10 MG TABS tablet   Other Relevant Orders   Microalbumin / creatinine urine ratio     Musculoskeletal and Integument   Plantar fasciitis of left foot   Left heel pain likely due to plantar fasciitis Advised to apply ice, perform leg elevation and rest Continue using orthotic shoes for better support If persistent pain, will get x-ray of foot        Other   Mixed hyperlipidemia (Chronic)   On Lipitor 80 mg QD and Zetia  10 mg QD Checked lipid profile - LDL 63, at goal, improved compared to prior      Relevant Medications   carvedilol  (COREG ) 3.125 MG tablet   Encounter for general adult medical examination with abnormal findings - Primary   Physical exam as documented. Fasting blood tests reviewed. Advised to get Tdap vaccines at local pharmacy.        Meds ordered this encounter  Medications   dapagliflozin  propanediol (FARXIGA ) 10 MG TABS tablet    Sig: Take 1 tablet (10 mg total) by mouth daily.    Dispense:  30 tablet    Refill:  11   carvedilol  (COREG ) 3.125 MG tablet    Sig: Take 1 tablet (3.125 mg total) by mouth 2 (two) times daily.    Dispense:  180 tablet    Refill:  3    Follow-up: Return in about 4 months (around 08/29/2024) for DM.    Suzzane MARLA Blanch, MD

## 2024-04-29 NOTE — Assessment & Plan Note (Signed)
 BP Readings from Last 1 Encounters:  04/29/24 124/68   Well-controlled with lisinopril  and Coreg  Counseled for compliance with the medications Advised DASH diet and moderate exercise/walking, at least 150 mins/week

## 2024-04-29 NOTE — Assessment & Plan Note (Signed)
S/p stent placement On aspirin and statin currently On Coreg Has Nitroglycerin PRN for chest pain Followed by cardiology

## 2024-05-01 LAB — MICROALBUMIN / CREATININE URINE RATIO
Creatinine, Urine: 45.5 mg/dL
Microalb/Creat Ratio: <7 mg/g{creat} (ref 0–29)
Microalbumin, Urine: <3 ug/mL

## 2024-05-26 ENCOUNTER — Ambulatory Visit: Payer: Self-pay | Attending: Internal Medicine | Admitting: Internal Medicine

## 2024-05-26 ENCOUNTER — Encounter: Payer: Self-pay | Admitting: Internal Medicine

## 2024-05-26 VITALS — BP 114/58 | HR 61 | Ht 70.0 in | Wt 184.8 lb

## 2024-05-26 DIAGNOSIS — I2510 Atherosclerotic heart disease of native coronary artery without angina pectoris: Secondary | ICD-10-CM | POA: Diagnosis not present

## 2024-05-26 DIAGNOSIS — Z7902 Long term (current) use of antithrombotics/antiplatelets: Secondary | ICD-10-CM | POA: Diagnosis not present

## 2024-05-26 DIAGNOSIS — E785 Hyperlipidemia, unspecified: Secondary | ICD-10-CM

## 2024-05-26 DIAGNOSIS — I10 Essential (primary) hypertension: Secondary | ICD-10-CM

## 2024-05-26 NOTE — Patient Instructions (Addendum)
 Medication Instructions:  Your physician recommends that you continue on your current medications as directed. Please refer to the Current Medication list given to you today.   Labwork: None  Testing/Procedures: None  Follow-Up: Your physician recommends that you schedule a follow-up appointment in: 1.5 years. You will receive a reminder call in about 10 months reminding you to schedule your appointment. If you don't receive this call, please contact our office.   Any Other Special Instructions Will Be Listed Below (If Applicable). Thank you for choosing [facility]!     If you need a refill on your cardiac medications before your next appointment, please call your pharmacy.
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
## 2024-06-04 ENCOUNTER — Encounter: Payer: Self-pay | Admitting: Emergency Medicine

## 2024-06-04 ENCOUNTER — Other Ambulatory Visit: Payer: Self-pay

## 2024-06-04 ENCOUNTER — Ambulatory Visit
Admission: EM | Admit: 2024-06-04 | Discharge: 2024-06-04 | Disposition: A | Discharge status: Home / Self Care | Attending: Family Medicine | Admitting: Family Medicine

## 2024-06-04 DIAGNOSIS — H00011 Hordeolum externum right upper eyelid: Secondary | ICD-10-CM

## 2024-06-04 DIAGNOSIS — E1165 Type 2 diabetes mellitus with hyperglycemia: Secondary | ICD-10-CM

## 2024-06-04 MED ORDER — TOBRAMYCIN 0.3 % OP OINT: TOPICAL_OINTMENT | OPHTHALMIC | 0 refills | Status: AC

## 2024-06-04 MED ORDER — METFORMIN HCL ER 500 MG PO TB24: 1000.0000 mg | ORAL_TABLET | Freq: Every day | ORAL | 3 refills | Status: AC

## 2024-06-04 NOTE — ED Triage Notes (Signed)
 Pt reports intermittent redness to upper eyelid for last several weeks. Pt denies any known injury or visual changes. Pt reports I think its a sty and I wanted to know what else to do for it. Has tried otc drops.

## 2024-06-04 NOTE — ED Provider Notes (Signed)
 [facility]   [id number] 06/04/24 Arrival Time: 9188  ASSESSMENT & PLAN:  1. Hordeolum externum of right upper eyelid     Meds ordered this encounter  Medications   tobramycin  (TOBREX ) 0.3 % ophthalmic ointment    Sig: Apply 1/2 inch ribbon to R eye TID for up to one week.    Dispense:  3.5 g    Refill:  0   Will f/u with ophthalmology if this is not improving.  Reviewed expectations re: course of current medical issues. Questions answered. Outlined signs and symptoms indicating need for more acute intervention. Patient verbalized understanding. After Visit Summary given.   SUBJECTIVE:  Jesse Jordan is a 73 y.o. male who presents with complaint of a small bump to upper R eyelid; x 2 weeks. OTC without relief. Denies visual disturbance.  OBJECTIVE:  Vitals:   06/04/24 0821  BP: 120/66  Pulse: 62  Resp: 20  Temp: 98.1 F (36.7 C)  TempSrc: Oral  SpO2: 93%    General appearance: alert; no distress HEENT: [place]; AT; PERRLA; no restriction of the extraocular movements OD: few mm erythematous stye of upper R lid; EOMI; conjunctivae normal; without eye drainage Neck: supple without LAD Lungs: clear to auscultation bilaterally; unlabored respirations Skin: warm and dry Psychological: alert and cooperative; normal mood and affect   Allergies  Allergen Reactions   Amoxicillin-Pot Clavulanate     severe diahrea-c.diff.    Past Medical History:  Diagnosis Date   Allergy 1975   Mold, pollen - shots then but now treated by over the counter meds   Anemia    Cataract 2018   Removed   Coronary artery disease    Diabetes mellitus without complication (HCC) 1995   Type 2 - medication   Encounter for general adult medical examination with abnormal findings 04/19/2023   GERD (gastroesophageal reflux disease) 1985   Treated by medication   Hypertension 2015   Treated by medication   Localized osteoporosis without current pathological fracture 01/18/2022    Mixed hyperlipidemia 02/06/2008   Myocardial infarction (HCC) 11/03/2019   Vestibular neuritis, bilateral    Social History   Socioeconomic History   Marital status: Married    Spouse name: Not on file   Number of children: 2   Years of education: Not on file   Highest education level: Not on file  Occupational History   Occupation: Retired Presenter, broadcasting  Tobacco Use   Smoking status: Never   Smokeless tobacco: Never   Tobacco comments:    Never  Vaping Use   Vaping status: Never Used  Substance and Sexual Activity   Alcohol use: Not Currently    Comment: Many years ago   Drug use: Not Currently    Types: Marijuana    Comment: Many years ago - 1973   Sexual activity: Yes    Birth control/protection: Other-see comments, None    Comment: Active some but have ED.  Other Topics Concern   Not on file  Social History Narrative   Not on file   Social Drivers of Health   Financial Resource Strain: Low Risk  (10/23/2023)   Overall Financial Resource Strain (CARDIA)    Difficulty of Paying Living Expenses: Not hard at all  Food Insecurity: No Food Insecurity (10/23/2023)   Hunger Vital Sign    Worried About Running Out of Food in the Last Year: Never true    Ran Out of Food in the Last Year: Never true  Transportation Needs: No Transportation  Needs (10/23/2023)   PRAPARE - Administrator, Civil Service (Medical): No    Lack of Transportation (Non-Medical): No  Physical Activity: Sufficiently Active (10/23/2023)   Exercise Vital Sign    Days of Exercise per Week: 3 days    Minutes of Exercise per Session: 120 min  Stress: No Stress Concern Present (10/23/2023)   [employer] of Occupational Health - Occupational Stress Questionnaire    Feeling of Stress : Not at all  Social Connections: Unknown (12/25/2022)   Received from [employer]   Social Connections    Social Connections: Not on file    Social Connections: Not on file  Intimate Partner Violence: Not At  Risk (10/23/2023)   Humiliation, Afraid, Rape, and Kick questionnaire    Fear of Current or Ex-Partner: No    Emotionally Abused: No    Physically Abused: No    Sexually Abused: No   Family History  Problem Relation Age of Onset   Hypertension Mother    Dementia Mother    Hyperlipidemia Mother    Cancer Father    Heart disease Father    Stroke Brother    Heart disease Brother    Past Surgical History:  Procedure Laterality Date   CARDIAC CATHETERIZATION  11/04/2019   PCI  to LCX   EYE SURGERY  2018   Cataracts removed   PARATHYROIDECTOMY N/A 12/10/2022   Procedure: NECK EXPLORATION WITH PARATHYROIDECTOMY;  Surgeon: Eletha Boas, MD;  Location: [facility];  Service: General;  Laterality: N/A;  2ND SCRUB PERSON TONJA Rolinda Rogue, MD 06/04/24 [phone]

## 2024-08-08 ENCOUNTER — Other Ambulatory Visit: Payer: Self-pay | Admitting: Internal Medicine

## 2024-08-08 DIAGNOSIS — E782 Mixed hyperlipidemia: Secondary | ICD-10-CM

## 2024-08-08 DIAGNOSIS — I25118 Atherosclerotic heart disease of native coronary artery with other forms of angina pectoris: Secondary | ICD-10-CM

## 2024-08-18 ENCOUNTER — Encounter: Payer: Self-pay | Admitting: Internal Medicine

## 2024-08-24 ENCOUNTER — Other Ambulatory Visit: Payer: Self-pay | Admitting: Internal Medicine

## 2024-08-24 ENCOUNTER — Encounter: Payer: Self-pay | Admitting: Internal Medicine

## 2024-08-24 DIAGNOSIS — E1165 Type 2 diabetes mellitus with hyperglycemia: Secondary | ICD-10-CM

## 2024-08-26 DIAGNOSIS — E1165 Type 2 diabetes mellitus with hyperglycemia: Secondary | ICD-10-CM | POA: Diagnosis not present

## 2024-08-27 LAB — CMP14+EGFR
ALT: 27 IU/L (ref 0–44)
AST: 23 IU/L (ref 0–40)
Albumin: 4.9 g/dL — ABNORMAL HIGH (ref 3.8–4.8)
Alkaline Phosphatase: 60 IU/L (ref 47–123)
BUN/Creatinine Ratio: 24 (ref 10–24)
BUN: 32 mg/dL — ABNORMAL HIGH (ref 8–27)
Bilirubin Total: 0.6 mg/dL (ref 0.0–1.2)
CO2: 21 mmol/L (ref 20–29)
Calcium: 9.1 mg/dL (ref 8.6–10.2)
Chloride: 102 mmol/L (ref 96–106)
Creatinine, Ser: 1.33 mg/dL — ABNORMAL HIGH (ref 0.76–1.27)
Globulin, Total: 2.5 g/dL (ref 1.5–4.5)
Glucose: 185 mg/dL — ABNORMAL HIGH (ref 70–99)
Potassium: 5 mmol/L (ref 3.5–5.2)
Sodium: 139 mmol/L (ref 134–144)
Total Protein: 7.4 g/dL (ref 6.0–8.5)
eGFR: 57 mL/min/1.73 — ABNORMAL LOW (ref >59)

## 2024-08-27 LAB — HEMOGLOBIN A1C
Est. average glucose Bld gHb Est-mCnc: 200 mg/dL
Hgb A1c MFr Bld: 8.6 % — ABNORMAL HIGH (ref 4.8–5.6)

## 2024-08-31 ENCOUNTER — Encounter: Payer: Self-pay | Admitting: Internal Medicine

## 2024-08-31 ENCOUNTER — Ambulatory Visit: Admitting: Internal Medicine

## 2024-08-31 VITALS — BP 115/68 | HR 65 | Ht 70.0 in | Wt 185.0 lb

## 2024-08-31 DIAGNOSIS — E782 Mixed hyperlipidemia: Secondary | ICD-10-CM

## 2024-08-31 DIAGNOSIS — I10 Essential (primary) hypertension: Secondary | ICD-10-CM | POA: Diagnosis not present

## 2024-08-31 DIAGNOSIS — Z7984 Long term (current) use of oral hypoglycemic drugs: Secondary | ICD-10-CM

## 2024-08-31 DIAGNOSIS — I25118 Atherosclerotic heart disease of native coronary artery with other forms of angina pectoris: Secondary | ICD-10-CM

## 2024-08-31 DIAGNOSIS — E1165 Type 2 diabetes mellitus with hyperglycemia: Secondary | ICD-10-CM | POA: Diagnosis not present

## 2024-08-31 DIAGNOSIS — E210 Primary hyperparathyroidism: Secondary | ICD-10-CM

## 2024-08-31 MED ORDER — GLIPIZIDE ER 5 MG PO TB24: 5.0000 mg | ORAL_TABLET | Freq: Every day | ORAL | 3 refills | Status: AC

## 2024-08-31 NOTE — Assessment & Plan Note (Signed)
S/p stent placement On aspirin and statin currently On Coreg Has Nitroglycerin PRN for chest pain Followed by cardiology

## 2024-08-31 NOTE — Assessment & Plan Note (Signed)
 BP Readings from Last 1 Encounters:  08/31/24 115/68   Well-controlled with lisinopril  and Coreg  Counseled for compliance with the medications Advised DASH diet and moderate exercise/walking, at least 150 mins/week

## 2024-08-31 NOTE — Assessment & Plan Note (Signed)
 Lab Results  Component Value Date   HGBA1C 8.6 (H) 08/26/2024   Uncontrolled Associated with CAD, HTN and HLD On Farxiga  10 mg QD, Metformin  1000 mg QD Had discussed about potential benefits and side effects of GLP-1 agonist, he would be a good candidate considering his cardiac history, but he wants to think about it - advised to follow diabetic diet On statin and ACEi F/u CMP and lipid panel Diabetic eye exam: Advised to follow up with Ophthalmology for diabetic eye exam

## 2024-08-31 NOTE — Progress Notes (Unsigned)
 Established Patient Office Visit  Subjective:  Patient ID: Jesse Jordan, male    DOB: [birth year]  Age: 73 y.o. MRN: [id number]  CC:  Chief Complaint  Patient presents with   Diabetes    4 month f/u for diabetes     HPI Jesse Jordan is a 73 y.o. male with past medical history of CAD s/p stent placement (2021), HTN, GERD, type II DM with HLD who presents for f/u of his chronic medical conditions.  HTN: BP is well-controlled. Takes medications regularly. Patient denies headache, dizziness, chest pain, dyspnea or palpitations.   CAD s/p stent placement in 2021: He denies any chest pain currently. He is on aspirin and statin currently.   Type II DM with HLD: He is on Farxiga  10 mg QD and metformin  1000 mg QD currently.  He denies any polyuria or polydipsia. His last HbA1C is 7.2 now, improved from 9.1 in 03/25.  He has started following low-carb diet since the last visit. He has not taken Glipizide .  His blood glucose levels are around 120-150s now. he is also on Lipitor 80 mg.  Primary hyperparathyroidism: He had parathyroidectomy in 03/24.  He sees Dr. Lenis for it.  Denies any abdominal pain, nausea, vomiting, hematuria or mood disturbance currently.  He complains of left foot pain, around the heel area for the last 3 months.  Pain is intermittent, worse with prolonged standing and better with rest.  He has tried using shoe inserts and recently changed his shoes, which has improved his pain.   Past Medical History:  Diagnosis Date   Allergy 1975   Mold, pollen - shots then but now treated by over the counter meds   Anemia    Cataract 2018   Removed   Coronary artery disease    Diabetes mellitus without complication (HCC) 1995   Type 2 - medication   Encounter for general adult medical examination with abnormal findings 04/19/2023   GERD (gastroesophageal reflux disease) 1985   Treated by medication   Hypertension 2015   Treated by medication   Localized osteoporosis without current  pathological fracture 01/18/2022   Mixed hyperlipidemia 02/06/2008   Myocardial infarction (HCC) 11/03/2019   Vestibular neuritis, bilateral     Past Surgical History:  Procedure Laterality Date   CARDIAC CATHETERIZATION  11/04/2019   PCI  to LCX   EYE SURGERY  2018   Cataracts removed   PARATHYROIDECTOMY N/A 12/10/2022   Procedure: NECK EXPLORATION WITH PARATHYROIDECTOMY;  Surgeon: Eletha Boas, MD;  Location: [facility];  Service: General;  Laterality: N/A;  2ND SCRUB PERSON PUJA    Family History  Problem Relation Age of Onset   Hypertension Mother    Dementia Mother    Hyperlipidemia Mother    Cancer Father    Heart disease Father    Stroke Brother    Heart disease Brother     Social History   Socioeconomic History   Marital status: Married    Spouse name: Not on file   Number of children: 2   Years of education: Not on file   Highest education level: Not on file  Occupational History   Occupation: Retired Presenter, [employer]  Tobacco Use   Smoking status: Never   Smokeless tobacco: Never   Tobacco comments:    Never  Vaping Use   Vaping status: Never Used  Substance and Sexual Activity   Alcohol use: Not Currently    Comment: Many years ago   Drug use: Not Currently  Types: Marijuana    Comment: Many years ago - 1973   Sexual activity: Yes    Birth control/protection: Other-see comments, None    Comment: Active some but have ED.  Other Topics Concern   Not on file  Social History Narrative   Not on file   Social Drivers of Health   Financial Resource Strain: Low Risk  (10/23/2023)   Overall Financial Resource Strain (CARDIA)    Difficulty of Paying Living Expenses: Not hard at all  Food Insecurity: No Food Insecurity (10/23/2023)   Hunger Vital Sign    Worried About Running Out of Food in the Last Year: Never true    Ran Out of Food in the Last Year: Never true  Transportation Needs: No Transportation Needs (10/23/2023)   PRAPARE - Therapist, Art (Medical): No    Lack of Transportation (Non-Medical): No  Physical Activity: Sufficiently Active (10/23/2023)   Exercise Vital Sign    Days of Exercise per Week: 3 days    Minutes of Exercise per Session: 120 min  Stress: No Stress Concern Present (10/23/2023)   [employer] of Occupational Health - Occupational Stress Questionnaire    Feeling of Stress : Not at all  Social Connections: Unknown (12/25/2022)   Received from [employer]   Social Connections    Social Connections: Not on file    Social Connections: Not on file  Intimate Partner Violence: Not At Risk (10/23/2023)   Humiliation, Afraid, Rape, and Kick questionnaire    Fear of Current or Ex-Partner: No    Emotionally Abused: No    Physically Abused: No    Sexually Abused: No    Outpatient Medications Prior to Visit  Medication Sig Dispense Refill   albuterol  (VENTOLIN  HFA) 108 (90 Base) MCG/ACT inhaler Inhale 2 puffs into the lungs every 6 (six) hours as needed. 8 g 0   aspirin EC 81 MG tablet Take 81 mg by mouth daily. Swallow whole.     atorvastatin  (LIPITOR) 80 MG tablet TAKE ONE TABLET DAILY BY MOUTH AT BEDTIME 90 tablet 2   carvedilol  (COREG ) 3.125 MG tablet Take 1 tablet (3.125 mg total) by mouth 2 (two) times daily. 180 tablet 3   cetirizine (ZYRTEC) 10 MG tablet Take 10 mg by mouth daily.     Cholecalciferol (VITAMIN D ) 50 MCG (2000 UT) tablet Take 2,000 Units by mouth daily.     Cyanocobalamin (VITAMIN B-12 PO) Take 1 tablet by mouth daily.     dapagliflozin  propanediol (FARXIGA ) 10 MG TABS tablet Take 1 tablet (10 mg total) by mouth daily. 30 tablet 11   ezetimibe  (ZETIA ) 10 MG tablet Take 1 tablet (10 mg total) by mouth daily. 90 tablet 3   FIBER PO Take 2 tablets by mouth daily.     fluticasone  (FLONASE ) 50 MCG/ACT nasal spray SPRAY 2 SPRAYS INTO EACH NOSTRIL EVERY DAY 48 mL 2   GARLIC PO Take 1 capsule by mouth daily.     Ginkgo Biloba (GNP GINGKO BILOBA EXTRACT PO) Take 1 capsule by  mouth daily.     lisinopril  (ZESTRIL ) 20 MG tablet TAKE ONE TABLET BY MOUTH EVERY DAY 90 tablet 2   metFORMIN  (GLUCOPHAGE -XR) 500 MG 24 hr tablet Take 2 tablets (1,000 mg total) by mouth daily with breakfast. 180 tablet 3   nitroGLYCERIN  (NITROSTAT ) 0.4 MG SL tablet Place 1 tablet (0.4 mg total) under the tongue every 5 (five) minutes as needed for chest pain. 10 tablet 2  NON FORMULARY Take 1 tablet by mouth daily. Coenzymated B-3     NON FORMULARY Take 1 tablet by mouth daily. Citrust Bermamot extract     Olopatadine  HCl (PATADAY ) 0.7 % SOLN Apply 1 drop to eye 2 (two) times daily. 5 mL 0   ondansetron  (ZOFRAN ) 4 MG tablet Take 1 tablet (4 mg total) by mouth every 6 (six) hours. 20 tablet 0   sildenafil  (VIAGRA ) 50 MG tablet Take 1 tablet (50 mg total) by mouth daily as needed for erectile dysfunction. 30 tablet 2   Thiamine HCl (VITAMIN B-1 PO) Take 1 tablet by mouth daily.     tobramycin  (TOBREX ) 0.3 % ophthalmic ointment Apply 1/2 inch ribbon to R eye TID for up to one week. 3.5 g 0   No facility-administered medications prior to visit.    Allergies  Allergen Reactions   Amoxicillin-Pot Clavulanate     severe diahrea-c.diff.    ROS Review of Systems  Constitutional:  Negative for chills and fever.  HENT:  Negative for congestion, postnasal drip and sore throat.   Eyes:  Negative for pain and discharge.  Respiratory:  Negative for cough and shortness of breath.   Cardiovascular:  Negative for chest pain and palpitations.  Gastrointestinal:  Negative for constipation, diarrhea, nausea and vomiting.  Endocrine: Negative for polydipsia and polyuria.  Genitourinary:  Negative for dysuria and hematuria.  Musculoskeletal:  Negative for neck pain and neck stiffness.       Left heel pain  Skin:  Negative for rash.  Neurological:  Negative for dizziness, weakness, numbness and headaches.  Psychiatric/Behavioral:  Negative for agitation and behavioral problems.       Objective:     Physical Exam Vitals reviewed.  Constitutional:      General: He is not in acute distress.    Appearance: He is not diaphoretic.  HENT:     Head: Normocephalic and atraumatic.     Nose: No congestion.     Mouth/Throat:     Mouth: Mucous membranes are moist.  Eyes:     General: No scleral icterus.    Extraocular Movements: Extraocular movements intact.  Cardiovascular:     Rate and Rhythm: Normal rate and regular rhythm.     Heart sounds: Normal heart sounds. No murmur heard. Pulmonary:     Breath sounds: Normal breath sounds. No wheezing or rales.  Abdominal:     Palpations: Abdomen is soft.     Tenderness: There is no abdominal tenderness.  Musculoskeletal:     Left hand: Tenderness (Third digit PIP joint) present. No swelling. Normal range of motion. Normal strength.     Cervical back: Neck supple. No tenderness.     Right lower leg: No edema.     Left lower leg: No edema.  Feet:     Comments: Left heel pain/tenderness Skin:    General: Skin is warm.     Findings: No rash.  Neurological:     General: No focal deficit present.     Mental Status: He is alert and oriented to person, place, and time.     Cranial Nerves: No cranial nerve deficit.     Sensory: No sensory deficit.     Motor: No weakness.  Psychiatric:        Mood and Affect: Mood normal.        Behavior: Behavior normal.     BP 115/68   Pulse 65   Ht 5' 10 (1.778 m)   Wt 185 lb (  83.9 kg)   SpO2 97%   BMI 26.54 kg/m  Wt Readings from Last 3 Encounters:  08/31/24 185 lb (83.9 kg)  05/26/24 184 lb 12.8 oz (83.8 kg)  04/29/24 185 lb 6.4 oz (84.1 kg)    Lab Results  Component Value Date   TSH 3.580 04/24/2024   Lab Results  Component Value Date   WBC 4.5 04/24/2024   HGB 14.1 04/24/2024   HCT 43.3 04/24/2024   MCV 99 (H) 04/24/2024   PLT 182 04/24/2024   Lab Results  Component Value Date   NA 139 08/26/2024   K 5.0 08/26/2024   CO2 21 08/26/2024   GLUCOSE 185 (H) 08/26/2024   BUN 32  (H) 08/26/2024   CREATININE 1.33 (H) 08/26/2024   BILITOT 0.6 08/26/2024   ALKPHOS 60 08/26/2024   AST 23 08/26/2024   ALT 27 08/26/2024   PROT 7.4 08/26/2024   ALBUMIN 4.9 (H) 08/26/2024   CALCIUM  9.1 08/26/2024   ANIONGAP 10 02/22/2024   EGFR 57 (L) 08/26/2024   Lab Results  Component Value Date   CHOL 117 04/24/2024   Lab Results  Component Value Date   HDL 32 (L) 04/24/2024   Lab Results  Component Value Date   LDLCALC 63 04/24/2024   Lab Results  Component Value Date   TRIG 119 04/24/2024   Lab Results  Component Value Date   CHOLHDL 3.7 04/24/2024   Lab Results  Component Value Date   HGBA1C 8.6 (H) 08/26/2024      Assessment & Plan:   Problem List Items Addressed This Visit   None     No orders of the defined types were placed in this encounter.   Follow-up: No follow-ups on file.    Suzzane MARLA Blanch, MD

## 2024-08-31 NOTE — Patient Instructions (Signed)
 Please start taking Glipizide  5 mg once daily.  Please continue to take other medications as prescribed.  Please maintain at least 64 ounces of water intake in a day.  Please continue to follow low carb diet and perform moderate exercise/walking at least 150 mins/week.  Please get fasting blood tests done before the next visit.

## 2024-09-03 NOTE — Assessment & Plan Note (Signed)
S/p parathyroidectomy Last CMP showed wnl calcium Followed by Dr Fransico Him

## 2024-09-03 NOTE — Assessment & Plan Note (Signed)
 On Lipitor 80 mg QD and Zetia  10 mg QD Checked lipid profile - LDL 63, at goal, improved compared to prior

## 2024-10-27 ENCOUNTER — Ambulatory Visit: Payer: Medicare HMO

## 2024-10-27 VITALS — Ht 70.0 in | Wt 175.0 lb

## 2024-10-27 DIAGNOSIS — Z0000 Encounter for general adult medical examination without abnormal findings: Secondary | ICD-10-CM

## 2024-10-27 NOTE — Patient Instructions (Signed)
 Jesse Jordan,  Thank you for taking the time for your Medicare Wellness Visit. I appreciate your continued commitment to your health goals. Please review the care plan we discussed, and feel free to reach out if I can assist you further.  Please note that Annual Wellness Visits do not include a physical exam. Some assessments may be limited, especially if the visit was conducted virtually. If needed, we may recommend an in-person follow-up with your provider.  Ongoing Care Seeing your primary care provider every 3 to 6 months helps us  monitor your health and provide consistent, personalized care.   Referrals If a referral was made during today's visit and you haven't received any updates within two weeks, please contact the referred provider directly to check on the status.  Recommended Screenings:  Health Maintenance  Topic Date Due   Eye exam for diabetics  01/18/2023   COVID-19 Vaccine (1 - 2025-26 season) Never done   Medicare Annual Wellness Visit  10/22/2024   Flu Shot  12/22/2024*   Kidney health urinalysis for diabetes  10/30/2024   Complete foot exam   12/26/2024   Hemoglobin A1C  02/24/2025   Yearly kidney function blood test for diabetes  08/26/2025   Colon Cancer Screening  09/03/2028   DTaP/Tdap/Td vaccine (2 - Tdap) 03/22/2030   Hepatitis C Screening  Completed   Meningitis B Vaccine  Aged Out   Pneumococcal Vaccine for age over 61  Discontinued   Zoster (Shingles) Vaccine  Discontinued  *Topic was postponed. The date shown is not the original due date.       10/27/2024   10:28 AM  Advanced Directives  Does Patient Have a Medical Advance Directive? No  Would patient like information on creating a medical advance directive? No - Patient declined    Vision: Annual vision screenings are recommended for early detection of glaucoma, cataracts, and diabetic retinopathy. These exams can also reveal signs of chronic conditions such as diabetes and high blood  pressure.  Dental: Annual dental screenings help detect early signs of oral cancer, gum disease, and other conditions linked to overall health, including heart disease and diabetes.  Please see the attached documents for additional preventive care recommendations.

## 2024-12-30 ENCOUNTER — Ambulatory Visit: Admitting: Internal Medicine

## 2025-10-29 ENCOUNTER — Ambulatory Visit: Payer: Self-pay
